# Patient Record
Sex: Female | Born: 1950 | Race: White | Hispanic: No | Marital: Married | Smoking: Never smoker
Health system: Southern US, Community
[De-identification: ages and names within clinical notes are randomized; demographics above are authoritative.]

## PROBLEM LIST (undated history)

## (undated) DIAGNOSIS — K8689 Other specified diseases of pancreas: Secondary | ICD-10-CM

## (undated) DIAGNOSIS — K838 Other specified diseases of biliary tract: Secondary | ICD-10-CM

## (undated) DIAGNOSIS — E669 Obesity, unspecified: Secondary | ICD-10-CM

## (undated) HISTORY — PX: ABDOMINAL HYSTERECTOMY: SHX81

## (undated) HISTORY — DX: Obesity, unspecified: E66.9

---

## 1984-10-23 HISTORY — PX: TOTAL ABDOMINAL HYSTERECTOMY W/ BILATERAL SALPINGOOPHORECTOMY: SHX83

## 2005-03-27 ENCOUNTER — Ambulatory Visit: Payer: Self-pay | Admitting: Internal Medicine

## 2006-05-17 ENCOUNTER — Ambulatory Visit: Payer: Self-pay | Admitting: Internal Medicine

## 2006-05-24 ENCOUNTER — Ambulatory Visit: Payer: Self-pay | Admitting: Internal Medicine

## 2006-06-01 ENCOUNTER — Ambulatory Visit: Payer: Self-pay | Admitting: Gastroenterology

## 2006-06-07 ENCOUNTER — Ambulatory Visit: Payer: Self-pay | Admitting: Gastroenterology

## 2006-06-20 ENCOUNTER — Ambulatory Visit: Payer: Self-pay | Admitting: Gastroenterology

## 2006-07-20 ENCOUNTER — Ambulatory Visit: Payer: Self-pay | Admitting: Gastroenterology

## 2006-08-13 ENCOUNTER — Ambulatory Visit: Payer: Self-pay | Admitting: Internal Medicine

## 2006-08-16 ENCOUNTER — Ambulatory Visit: Payer: Self-pay | Admitting: Gastroenterology

## 2006-08-27 ENCOUNTER — Ambulatory Visit: Payer: Self-pay | Admitting: Gastroenterology

## 2006-09-06 ENCOUNTER — Ambulatory Visit: Payer: Self-pay | Admitting: Gastroenterology

## 2006-09-14 ENCOUNTER — Ambulatory Visit: Payer: Self-pay | Admitting: Gastroenterology

## 2006-10-18 ENCOUNTER — Ambulatory Visit: Payer: Self-pay | Admitting: Gastroenterology

## 2006-10-23 HISTORY — PX: SMALL INTESTINE SURGERY: SHX150

## 2007-02-08 ENCOUNTER — Ambulatory Visit: Payer: Self-pay | Admitting: Gastroenterology

## 2007-02-28 ENCOUNTER — Ambulatory Visit: Payer: Self-pay | Admitting: Gastroenterology

## 2007-03-14 ENCOUNTER — Ambulatory Visit: Payer: Self-pay | Admitting: Nurse Practitioner

## 2007-07-04 ENCOUNTER — Ambulatory Visit: Payer: Self-pay | Admitting: Gastroenterology

## 2007-07-06 ENCOUNTER — Inpatient Hospital Stay: Payer: Self-pay | Admitting: Surgery

## 2007-07-08 ENCOUNTER — Other Ambulatory Visit: Payer: Self-pay

## 2007-07-23 ENCOUNTER — Other Ambulatory Visit: Payer: Self-pay

## 2007-07-24 ENCOUNTER — Inpatient Hospital Stay: Payer: Self-pay | Admitting: Internal Medicine

## 2007-07-31 ENCOUNTER — Ambulatory Visit: Payer: Self-pay | Admitting: Internal Medicine

## 2007-08-19 ENCOUNTER — Ambulatory Visit: Payer: Self-pay | Admitting: Internal Medicine

## 2007-10-03 ENCOUNTER — Ambulatory Visit: Payer: Self-pay | Admitting: Gastroenterology

## 2007-10-24 HISTORY — PX: CHOLECYSTECTOMY: SHX55

## 2008-01-16 ENCOUNTER — Ambulatory Visit: Payer: Self-pay | Admitting: Internal Medicine

## 2008-02-17 ENCOUNTER — Ambulatory Visit: Payer: Self-pay | Admitting: Internal Medicine

## 2008-05-11 ENCOUNTER — Ambulatory Visit: Payer: Self-pay | Admitting: Internal Medicine

## 2008-09-14 ENCOUNTER — Ambulatory Visit: Payer: Self-pay | Admitting: Internal Medicine

## 2009-01-01 ENCOUNTER — Ambulatory Visit: Payer: Self-pay | Admitting: Internal Medicine

## 2009-03-01 ENCOUNTER — Ambulatory Visit: Payer: Self-pay | Admitting: Internal Medicine

## 2009-04-22 ENCOUNTER — Ambulatory Visit: Payer: Self-pay | Admitting: Internal Medicine

## 2009-06-18 ENCOUNTER — Ambulatory Visit: Payer: Self-pay | Admitting: Internal Medicine

## 2009-10-25 ENCOUNTER — Ambulatory Visit: Payer: Self-pay | Admitting: Internal Medicine

## 2010-02-14 ENCOUNTER — Ambulatory Visit: Payer: Self-pay | Admitting: Internal Medicine

## 2010-02-25 ENCOUNTER — Ambulatory Visit: Payer: Self-pay | Admitting: Internal Medicine

## 2010-03-28 ENCOUNTER — Ambulatory Visit: Payer: Self-pay | Admitting: Internal Medicine

## 2010-05-02 ENCOUNTER — Ambulatory Visit: Payer: Self-pay | Admitting: Internal Medicine

## 2010-05-25 ENCOUNTER — Ambulatory Visit: Payer: Self-pay | Admitting: Internal Medicine

## 2010-08-25 ENCOUNTER — Ambulatory Visit: Payer: Self-pay | Admitting: Internal Medicine

## 2010-11-25 ENCOUNTER — Ambulatory Visit: Payer: Self-pay | Admitting: Internal Medicine

## 2011-03-13 ENCOUNTER — Ambulatory Visit: Payer: Self-pay | Admitting: Internal Medicine

## 2011-05-11 ENCOUNTER — Ambulatory Visit: Payer: Self-pay | Admitting: Internal Medicine

## 2011-05-26 ENCOUNTER — Ambulatory Visit: Payer: Self-pay | Admitting: Internal Medicine

## 2011-06-14 ENCOUNTER — Encounter: Payer: Self-pay | Admitting: Internal Medicine

## 2011-06-14 DIAGNOSIS — E119 Type 2 diabetes mellitus without complications: Secondary | ICD-10-CM | POA: Insufficient documentation

## 2011-06-14 DIAGNOSIS — I10 Essential (primary) hypertension: Secondary | ICD-10-CM

## 2011-06-16 ENCOUNTER — Other Ambulatory Visit: Payer: Self-pay | Admitting: Internal Medicine

## 2011-06-16 MED ORDER — LIRAGLUTIDE 18 MG/3ML ~~LOC~~ SOLN: SUBCUTANEOUS | Status: DC

## 2011-06-23 ENCOUNTER — Ambulatory Visit (INDEPENDENT_AMBULATORY_CARE_PROVIDER_SITE_OTHER): Payer: PRIVATE HEALTH INSURANCE | Admitting: Internal Medicine

## 2011-06-23 ENCOUNTER — Encounter: Payer: Self-pay | Admitting: Internal Medicine

## 2011-06-23 DIAGNOSIS — E119 Type 2 diabetes mellitus without complications: Secondary | ICD-10-CM

## 2011-06-23 DIAGNOSIS — H04129 Dry eye syndrome of unspecified lacrimal gland: Secondary | ICD-10-CM

## 2011-06-23 DIAGNOSIS — E669 Obesity, unspecified: Secondary | ICD-10-CM

## 2011-06-23 MED ORDER — LIRAGLUTIDE 18 MG/3ML ~~LOC~~ SOLN: SUBCUTANEOUS | Status: DC

## 2011-06-23 NOTE — Progress Notes (Signed)
  Subjective:    Patient ID: Kimberly Brady, female    DOB: [birth year], 60 y.o.   MRN: [id number]  HPI Patient hre to followup on diabetes, obesity, and hyperlipidemia.  Has been having trouble with her vision despite recent testing and two changes to the  prescription of her refractive lenses.  She has been doing some research on the internet and has many of the symptoms of dry eye syndrome.  Has an appt to see her opthalmologist again in 2 weeks but is concerned about the progressive vision loss in her  right eye, now 20/50. She has also been losing weight gradually using a weight loss method called JUDDD (caloric restriction followd by liberalization, alternating days).   Review of Systems  Constitutional: Negative for chills, fatigue and unexpected weight change.  HENT: Negative for hearing loss, ear pain, nosebleeds, congestion, sore throat, facial swelling, rhinorrhea, sneezing, mouth sores, trouble swallowing, neck pain, neck stiffness, voice change, postnasal drip, sinus pressure, tinnitus and ear discharge.   Eyes: Positive for visual disturbance. Negative for pain, discharge and redness.  Respiratory: Negative for cough, chest tightness, shortness of breath, wheezing and stridor.   Cardiovascular: Negative for chest pain, palpitations and leg swelling.  Musculoskeletal: Negative for myalgias and arthralgias.  Skin: Negative for color change and rash.  Neurological: Negative for dizziness, weakness, light-headedness and headaches.  Hematological: Negative for adenopathy.       Objective:   Physical Exam  Constitutional: She is oriented to person, place, and time. She appears well-developed and well-nourished.  HENT:  Mouth/Throat: Oropharynx is clear and moist.  Eyes: EOM are normal. Pupils are equal, round, and reactive to light. No scleral icterus.  Neck: Normal range of motion. Neck supple. No JVD present. No thyromegaly present.  Cardiovascular: Normal rate, regular rhythm,  normal heart sounds and intact distal pulses.   Pulmonary/Chest: Effort normal and breath sounds normal.  Abdominal: Soft. Bowel sounds are normal. She exhibits no mass. There is no tenderness.  Musculoskeletal: Normal range of motion. She exhibits no edema.  Lymphadenopathy:    She has no cervical adenopathy.  Neurological: She is alert and oriented to person, place, and time.  Skin: Skin is warm and dry.  Psychiatric: She has a normal mood and affect.          Assessment & Plan:  Dm  excelletn control,  hgba1c was 5.7 recently.  Has lost 15 lbs since last visit. Using JUDDD (up day, down day diet) with consistent results .

## 2011-06-23 NOTE — Patient Instructions (Addendum)
Your diabetes control is excellent and your weight loss is fantastic.  Continue everything you are doing but add 30 minutes of exercise to your daily routine a minium of 5 days per week.  If your weight plateaus, we can discuss an appetite suppressant. \ Trial of Systane eye drop to moisten your eyes

## 2011-06-24 DIAGNOSIS — E669 Obesity, unspecified: Secondary | ICD-10-CM | POA: Insufficient documentation

## 2011-06-24 DIAGNOSIS — H04129 Dry eye syndrome of unspecified lacrimal gland: Secondary | ICD-10-CM | POA: Insufficient documentation

## 2011-06-24 MED ORDER — POLYETHYL GLYCOL-PROPYL GLYCOL 0.4-0.3 % OP SOLN: 1.0000 [drp] | Freq: Four times a day (QID) | OPHTHALMIC | Status: DC

## 2011-06-24 NOTE — Assessment & Plan Note (Addendum)
Excellent control using Victoza injections and carbohydrate modified diet. BS are generally < 130 in the AM fasting and <150 2 hr post prandially.

## 2011-06-24 NOTE — Assessment & Plan Note (Signed)
Suggested by history of DM, demographics, and symptoms of blurred vision despite normal recent retinal exam. Will treat empirically with Systane eye drops pending repeat evaluation by Optho

## 2011-06-26 ENCOUNTER — Other Ambulatory Visit: Payer: Self-pay | Admitting: Internal Medicine

## 2011-09-06 ENCOUNTER — Telehealth: Payer: Self-pay | Admitting: Internal Medicine

## 2011-09-06 NOTE — Telephone Encounter (Signed)
Notified patient of results 

## 2011-09-06 NOTE — Telephone Encounter (Signed)
Labs were excellent hgba1c 6.0.

## 2011-09-22 ENCOUNTER — Encounter: Payer: Self-pay | Admitting: Internal Medicine

## 2011-09-22 ENCOUNTER — Ambulatory Visit (INDEPENDENT_AMBULATORY_CARE_PROVIDER_SITE_OTHER): Payer: PRIVATE HEALTH INSURANCE | Admitting: Internal Medicine

## 2011-09-22 DIAGNOSIS — E119 Type 2 diabetes mellitus without complications: Secondary | ICD-10-CM

## 2011-09-22 DIAGNOSIS — H04129 Dry eye syndrome of unspecified lacrimal gland: Secondary | ICD-10-CM

## 2011-09-22 DIAGNOSIS — H269 Unspecified cataract: Secondary | ICD-10-CM

## 2011-09-22 DIAGNOSIS — E785 Hyperlipidemia, unspecified: Secondary | ICD-10-CM

## 2011-09-22 MED ORDER — LIRAGLUTIDE 18 MG/3ML ~~LOC~~ SOLN: SUBCUTANEOUS | Status: DC

## 2011-09-22 NOTE — Progress Notes (Signed)
Subjective:    Patient ID: Kimberly Brady, female    DOB: [birth year], 60 y.o.   MRN: [id number]  HPI     Kimberly Brady is an 60 y.o. female who presents for follow up of diabetes. Current symptoms include: none. Patient denies foot ulcerations, hypoglycemia  and nausea. Evaluation to date has included: fasting blood sugar, fasting lipid panel, hemoglobin A1C and microalbuminuria. Home sugars: BGs consistently in an acceptable range. Current treatments: more intensive attention to diet which has been effective, increased aerobic exercise which has been not very effective and continued use of Victoza. Last dilated eye exam within the pst 6 months , with early cataractsw noted by Dr. Clotilde Dieter for serial cataract surgery Dec 12 and Jan 2 nd. preop is next Wednesday.  The following portions of the patient's history were reviewed and updated as appropriate: allergies, current medications, past family history, past medical history, past social history, past surgical history and problem list.  Review of Systems A comprehensive review of systems was negative.     Physical Exam     Objective:    BP 124/76  Pulse 68  Temp(Src) 98.2 F (36.8 C) (Oral)  Resp 16  Ht 5' 1.5" (1.562 m)  Wt 211 lb 8 oz (95.936 kg)  BMI 39.32 kg/m2  SpO2 98%  General Appearance:    Alert, cooperative, no distress, appears stated age  Head:    Normocephalic, without obvious abnormality, atraumatic  Eyes:    PERRL, conjunctiva/corneas clear, EOM's intact, fundi    benign, both eyes  Ears:    Normal TM's and external ear canals, both ears  Nose:   Nares normal, septum midline, mucosa normal, no drainage    or sinus tenderness  Throat:   Lips, mucosa, and tongue normal; teeth and gums normal  Neck:   Supple, symmetrical, trachea midline, no adenopathy;    thyroid:  no enlargement/tenderness/nodules; no carotid   bruit or JVD  Back:     Symmetric, no curvature, ROM normal, no CVA tenderness  Lungs:     Clear  to auscultation bilaterally, respirations unlabored  Chest Wall:    No tenderness or deformity   Heart:    Regular rate and rhythm, S1 and S2 normal, no murmur, rub   or gallop  Abdomen:     Soft, non-tender, bowel sounds active all four quadrants,    no masses, no organomegaly  Extremities:   Extremities normal, atraumatic, no cyanosis or edema  Pulses:   2+ and symmetric all extremities  Skin:   Skin color, texture, turgor normal, no rashes or lesions  Lymph nodes:   Cervical, supraclavicular, and axillary nodes normal  Neurologic:   CNII-XII intact, normal strength, sensation and reflexes    throughout    Laboratory:   Revieed labs from [facility] in October .  hgba1c waws 6.0, HD 44 and LDL 78.  Normal liver and kidney function.   Assessment:    Diabetes mellitus Type II, under excellent control.  Obesity Early Cataracts   Plan:    Dry eye syndrome Her recent symptoms have been evaluated by [facility] and are not dry eye but are due to catrqcts.   Bilateral cataracts Found during recent optho evaluation for vision changes initial attributed to dry eye syndrome. For surgery in the next 30 days on both, by [facility].   Hyperlipidemia LDL goal < 70 LDL was 78 in October on red yeast rice.  No changes today to treatment.     Updated  Medication List Outpatient Encounter Prescriptions as of 09/22/2011  Medication Sig Dispense Refill  . Liraglutide (VICTOZA) 18 MG/3ML SOLN Inject 1.8 mg subcutaneous daily   27 mL  3  . Red Yeast Rice Extract 600 MG CAPS Take 600 mg by mouth 2 (two) times daily.        Marland Kitchen DISCONTD: Liraglutide (VICTOZA) 18 MG/3ML SOLN Inject 1.8 mg subcutaneous daily   27 mL  3  . DISCONTD: Polyethyl Glycol-Propyl Glycol (SYSTANE) 0.4-0.3 % SOLN Apply 1 drop to eye QID.  1 Bottle  3     Discussed general issues about diabetes pathophysiology and management. Addressed ADA diet. Encouraged aerobic exercise. Follow up in 3 months or as needed.  For cataract surgery  on both eyes scheduled for the next 30 days

## 2011-09-24 ENCOUNTER — Encounter: Payer: Self-pay | Admitting: Internal Medicine

## 2011-09-24 DIAGNOSIS — E785 Hyperlipidemia, unspecified: Secondary | ICD-10-CM | POA: Insufficient documentation

## 2011-09-24 DIAGNOSIS — H269 Unspecified cataract: Secondary | ICD-10-CM | POA: Insufficient documentation

## 2011-09-24 NOTE — Assessment & Plan Note (Signed)
LDL was 78 in October on red yeast rice.  No changes today to treatment.

## 2011-09-24 NOTE — Assessment & Plan Note (Signed)
Found during recent optho evaluation for vision changes initial attributed to dry eye syndrome. For surgery in the next 30 days on both, by [facility].

## 2011-09-24 NOTE — Assessment & Plan Note (Signed)
Her recent symptoms have been evaluated by [facility] and are not dry eye but are due to catrqcts.

## 2011-10-04 ENCOUNTER — Ambulatory Visit: Payer: Self-pay | Admitting: Ophthalmology

## 2011-10-18 ENCOUNTER — Telehealth: Payer: Self-pay | Admitting: *Deleted

## 2011-10-18 NOTE — Telephone Encounter (Signed)
Pt states the victoza script that was ordered for her at her last visit was never received by her pharmacy- it looks like script was printed in error.  She asked that this be called in to [facility] in [place].  Script called in.

## 2011-10-24 HISTORY — PX: EYE SURGERY: SHX253

## 2011-10-25 ENCOUNTER — Ambulatory Visit: Payer: Self-pay | Admitting: Ophthalmology

## 2011-12-22 ENCOUNTER — Ambulatory Visit: Payer: PRIVATE HEALTH INSURANCE | Admitting: Internal Medicine

## 2011-12-29 ENCOUNTER — Ambulatory Visit (INDEPENDENT_AMBULATORY_CARE_PROVIDER_SITE_OTHER): Payer: PRIVATE HEALTH INSURANCE | Admitting: Internal Medicine

## 2011-12-29 ENCOUNTER — Encounter: Payer: Self-pay | Admitting: Internal Medicine

## 2011-12-29 DIAGNOSIS — E785 Hyperlipidemia, unspecified: Secondary | ICD-10-CM

## 2011-12-29 DIAGNOSIS — E1136 Type 2 diabetes mellitus with diabetic cataract: Secondary | ICD-10-CM

## 2011-12-29 DIAGNOSIS — I10 Essential (primary) hypertension: Secondary | ICD-10-CM

## 2011-12-29 DIAGNOSIS — E1339 Other specified diabetes mellitus with other diabetic ophthalmic complication: Secondary | ICD-10-CM

## 2011-12-29 DIAGNOSIS — E1139 Type 2 diabetes mellitus with other diabetic ophthalmic complication: Secondary | ICD-10-CM

## 2011-12-29 DIAGNOSIS — E1336 Other specified diabetes mellitus with diabetic cataract: Secondary | ICD-10-CM

## 2011-12-29 DIAGNOSIS — E119 Type 2 diabetes mellitus without complications: Secondary | ICD-10-CM

## 2011-12-29 DIAGNOSIS — H579 Unspecified disorder of eye and adnexa: Secondary | ICD-10-CM

## 2011-12-29 MED ORDER — LIRAGLUTIDE 18 MG/3ML ~~LOC~~ SOLN: SUBCUTANEOUS | Status: DC

## 2011-12-29 NOTE — Assessment & Plan Note (Addendum)
hgba1c was 6.3 in [facility]   No changes today.  Continue Victoza.

## 2011-12-29 NOTE — Progress Notes (Signed)
  Subjective:    Patient ID: Kimberly Brady, female    DOB: [birth year], 61 y.o.   MRN: [id number]  HPI  Kimberly Brady returns for follow up on diabetes and obesity.  She is walking three times weekly, but has not been following a diet. Has gained 3 lbs in the interim.  No hypoglycemic episodes on current regimen of Victoza.  Had cataract surgery on both eyes recently with good results.   Past Medical History  Diagnosis Date  . Hypertension   . Diabetes mellitus     type 2  . Obesity (BMI 30-39.9)    Current Outpatient Prescriptions on File Prior to Visit  Medication Sig Dispense Refill  . Red Yeast Rice Extract 600 MG CAPS Take 600 mg by mouth 2 (two) times daily.            Review of Systems  Constitutional: Negative for fever, chills and unexpected weight change.  HENT: Negative for hearing loss, ear pain, nosebleeds, congestion, sore throat, facial swelling, rhinorrhea, sneezing, mouth sores, trouble swallowing, neck pain, neck stiffness, voice change, postnasal drip, sinus pressure, tinnitus and ear discharge.   Eyes: Negative for pain, discharge, redness and visual disturbance.  Respiratory: Negative for cough, chest tightness, shortness of breath, wheezing and stridor.   Cardiovascular: Negative for chest pain, palpitations and leg swelling.  Musculoskeletal: Negative for myalgias and arthralgias.  Skin: Negative for color change and rash.  Neurological: Negative for dizziness, weakness, light-headedness and headaches.  Hematological: Negative for adenopathy.       Objective:   Physical Exam  Constitutional: She is oriented to person, place, and time. She appears well-developed and well-nourished.  HENT:  Mouth/Throat: Oropharynx is clear and moist.  Eyes: EOM are normal. Pupils are equal, round, and reactive to light. No scleral icterus.  Neck: Normal range of motion. Neck supple. No JVD present. No thyromegaly present.  Cardiovascular: Normal rate, regular rhythm, normal heart  sounds and intact distal pulses.   Pulmonary/Chest: Effort normal and breath sounds normal.  Abdominal: Soft. Bowel sounds are normal. She exhibits no mass. There is no tenderness.  Musculoskeletal: Normal range of motion. She exhibits no edema.  Lymphadenopathy:    She has no cervical adenopathy.  Neurological: She is alert and oriented to person, place, and time.  Skin: Skin is warm and dry.  Psychiatric: She has a normal mood and affect.      Assessment & Plan:   Diabetes mellitus hgba1c was 6.3 [facility]   No changes today.  Continue Victoza.  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    Hypertension Well controlled on current medications.  No changes today.   Hyperlipidemia LDL goal < 70 LDL is near goal without statins,  Continue red yeast rice.     Updated Medication List Outpatient Encounter Prescriptions as of 12/29/2011  Medication Sig Dispense Refill  . Liraglutide (VICTOZA) 18 MG/3ML SOLN Inject 1.8 mg subcutaneous daily  27 mL  3  . Red Yeast Rice Extract 600 MG CAPS Take 600 mg by mouth 2 (two) times daily.        Marland Kitchen DISCONTD: Liraglutide (VICTOZA) 18 MG/3ML SOLN Inject 1.8 mg subcutaneous daily   27 mL  3

## 2011-12-29 NOTE — Assessment & Plan Note (Addendum)
She has had extraction of the right eye, followed by left surgery in December  By [facility].

## 2011-12-31 ENCOUNTER — Encounter: Payer: Self-pay | Admitting: Internal Medicine

## 2011-12-31 NOTE — Assessment & Plan Note (Signed)
LDL is near goal without statins,  Continue red yeast rice.

## 2011-12-31 NOTE — Assessment & Plan Note (Signed)
Well controlled on current medications.  No changes today. 

## 2012-03-08 ENCOUNTER — Ambulatory Visit: Payer: Self-pay | Admitting: Internal Medicine

## 2012-03-08 LAB — LIPID PANEL
Cholesterol: 174 mg/dL (ref 0–200)
HDL Cholesterol: 46 mg/dL (ref 40–60)
Ldl Cholesterol, Calc: 100 mg/dL (ref 0–100)

## 2012-03-14 ENCOUNTER — Telehealth: Payer: Self-pay | Admitting: Internal Medicine

## 2012-03-14 NOTE — Telephone Encounter (Signed)
Patient stated they are not going to a new doctor she stated "Dr. Darrick Huntsman ought to know better than that!"  She needs the labs for diabetic counseling.

## 2012-03-14 NOTE — Telephone Encounter (Signed)
Yes I have received both of their labs. Are they changing doctors?  I  Will give them to you to fax

## 2012-03-14 NOTE — Telephone Encounter (Signed)
[phone] Pt called to see if you received labs from [facility] the lab were done Friday 17 she needs this faxed to [phone] She needs them to take to doc tomorrow

## 2012-03-21 ENCOUNTER — Encounter: Payer: Self-pay | Admitting: Internal Medicine

## 2012-04-05 ENCOUNTER — Ambulatory Visit: Payer: PRIVATE HEALTH INSURANCE | Admitting: Internal Medicine

## 2012-04-12 ENCOUNTER — Ambulatory Visit (INDEPENDENT_AMBULATORY_CARE_PROVIDER_SITE_OTHER): Payer: PRIVATE HEALTH INSURANCE | Admitting: Internal Medicine

## 2012-04-12 ENCOUNTER — Encounter: Payer: Self-pay | Admitting: Internal Medicine

## 2012-04-12 VITALS — BP 112/78 | HR 66 | Temp 98.0°F | Resp 14 | Wt 211.5 lb

## 2012-04-12 DIAGNOSIS — I10 Essential (primary) hypertension: Secondary | ICD-10-CM

## 2012-04-12 DIAGNOSIS — E119 Type 2 diabetes mellitus without complications: Secondary | ICD-10-CM

## 2012-04-12 DIAGNOSIS — E785 Hyperlipidemia, unspecified: Secondary | ICD-10-CM

## 2012-04-12 DIAGNOSIS — E669 Obesity, unspecified: Secondary | ICD-10-CM

## 2012-04-12 MED ORDER — LIRAGLUTIDE 18 MG/3ML ~~LOC~~ SOLN: SUBCUTANEOUS | Status: DC

## 2012-04-14 ENCOUNTER — Encounter: Payer: Self-pay | Admitting: Internal Medicine

## 2012-04-14 NOTE — Progress Notes (Signed)
Patient ID: Kimberly Brady, female   DOB: 05/20/1951, 61 y.o.   MRN: [id number]  Patient Active Problem List  Diagnosis  . Hypertension  . Diabetes mellitus type 2 in obese  . Obesity (BMI 30-39.9)  . Bilateral cataracts  . Hyperlipidemia LDL goal < 70  . 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 Follow-up    3 month    HPI:   Kimberly Brady a 61 y.o. female who presents for follow up on diabetes mellitus, hyperlipidemia, and obesity.  She has made no significant change in her weight due to her dietary habits and lack of exercise.  She feels generally well, has no joint complaints, chst pian or shortness of breath.  Blood sugars remian well controlled on current regimen. Denies nausea, vomiting or diarrhea.    Past Medical History  Diagnosis Date  . Hypertension   . Diabetes mellitus     type 2  . Obesity (BMI 30-39.9)     Past Surgical History  Procedure Date  . Total abdominal hysterectomy w/ bilateral salpingoophorectomy 1986    for polycystic ovaries,endometriosis  . Cholecystectomy 2009    lapaoscopic   The following portions of the patient's history were reviewed and updated as appropriate: Allergies, current medications, and problem list.   Review of Systems:  A  comprehensive  review of systems was negative except those addressed in the HPI,     History   Social History  . Marital Status: Married    Spouse Name: N/A    Number of Children: N/A  . Years of Education: N/A   Occupational History  . Not on file.   Social History Main Topics  . Smoking status: Never Smoker   . Smokeless tobacco: Never Used  . Alcohol Use: No  . Drug Use: No  . Sexually Active: Not on file   Other Topics Concern  . Not on file   Social History Narrative   THERE ARE LAB RESULTS IN THE PAPER CHART BUT NOT UPDATED IN COMPUTER SYSTEM AS I WAS UNABLE TO READ THE RESULTS ACCURATELY...    Objective:  BP  112/78  Pulse 66  Temp 98 F (36.7 C) (Oral)  Resp 14  Wt 211 lb 8 oz (95.936 kg)  SpO2 97%  General appearance: alert, cooperative and appears stated age Ears: normal TM's and external ear canals both ears Throat: lips, mucosa, and tongue normal; teeth and gums normal Neck: no adenopathy, no carotid bruit, supple, symmetrical, trachea midline and thyroid not enlarged, symmetric, no tenderness/mass/nodules Back: symmetric, no curvature. ROM normal. No CVA tenderness. Lungs: clear to auscultation bilaterally Heart: regular rate and rhythm, S1, S2 normal, no murmur, click, rub or gallop Abdomen: soft, non-tender; bowel sounds normal; no masses,  no organomegaly Pulses: 2+ and symmetric Skin: Skin color, texture, turgor normal. No rashes or lesions Lymph nodes: Cervical, supraclavicular, and axillary nodes normal.  Assessment and Plan:  Obesity (BMI 30-39.9) Advised to resume her or my diet and exercise daily.   Diabetes mellitus type 2 in obese Well controlled on current medications.  hgba1c is < 7.0 No changes today.  Hyperlipidemia LDL goal < 70 LDL remains above goal without medications. given her history of pancreatitis and elevated LFTs, will hold off on statin therapy unless LDl is significantly over 347.  Hypertension Well controlled on current regimen. Renal function stable, no changes today.   Updated Medication List Outpatient  Encounter Prescriptions as of 04/12/2012  Medication Sig Dispense Refill  . Liraglutide (VICTOZA) 18 MG/3ML SOLN Inject 1.8 mg subcutaneous daily  27 mL  3  . Red Yeast Rice Extract 600 MG CAPS Take 600 mg by mouth 2 (two) times daily.        Marland Kitchen DISCONTD: Liraglutide (VICTOZA) 18 MG/3ML SOLN Inject 1.8 mg subcutaneous daily  27 mL  3     No orders of the defined types were placed in this encounter.    Return in about 3 months (around 07/13/2012).

## 2012-04-14 NOTE — Assessment & Plan Note (Signed)
Well controlled on current regimen. Renal function stable, no changes today. 

## 2012-04-14 NOTE — Assessment & Plan Note (Signed)
Well controlled on current medications.  hgba1c is < 7.0 No changes today.

## 2012-04-14 NOTE — Assessment & Plan Note (Signed)
Advised to resume her or my diet and exercise daily.

## 2012-04-14 NOTE — Assessment & Plan Note (Signed)
LDL remains above goal without medications. given her history of pancreatitis and elevated LFTs, will hold off on statin therapy unless LDl is significantly over 161.

## 2012-05-10 ENCOUNTER — Ambulatory Visit: Payer: Self-pay | Admitting: Emergency Medicine

## 2012-05-10 LAB — URINALYSIS, COMPLETE
Bilirubin,UR: NEGATIVE
Glucose,UR: NEGATIVE mg/dL (ref 0–75)
Ketone: NEGATIVE
Nitrite: NEGATIVE
Ph: 5 (ref 4.5–8.0)
Protein: NEGATIVE
Specific Gravity: 1.02 (ref 1.003–1.030)

## 2012-05-10 LAB — COMPREHENSIVE METABOLIC PANEL
Albumin: 3.6 g/dL (ref 3.4–5.0)
Alkaline Phosphatase: 91 U/L (ref 50–136)
Anion Gap: 3 — ABNORMAL LOW (ref 7–16)
BUN: 14 mg/dL (ref 7–18)
Bilirubin,Total: 0.9 mg/dL (ref 0.2–1.0)
Calcium, Total: 9.1 mg/dL (ref 8.5–10.1)
Chloride: 103 mmol/L (ref 98–107)
Co2: 33 mmol/L — ABNORMAL HIGH (ref 21–32)
Creatinine: 0.85 mg/dL (ref 0.60–1.30)
EGFR (African American): >60
EGFR (Non-African Amer.): >60
Glucose: 101 mg/dL — ABNORMAL HIGH (ref 65–99)
Osmolality: 278 (ref 275–301)
Potassium: 4.5 mmol/L (ref 3.5–5.1)
SGOT(AST): 24 U/L (ref 15–37)
SGPT (ALT): 37 U/L
Sodium: 139 mmol/L (ref 136–145)
Total Protein: 7.9 g/dL (ref 6.4–8.2)

## 2012-05-10 LAB — CBC WITH DIFFERENTIAL/PLATELET
Basophil #: 0 10*3/uL (ref 0.0–0.1)
Basophil %: 0.8 %
Eosinophil #: 0 10*3/uL (ref 0.0–0.7)
Eosinophil %: 0.5 %
HCT: 42.8 % (ref 35.0–47.0)
HGB: 14.8 g/dL (ref 12.0–16.0)
Lymphocyte #: 0.8 10*3/uL — ABNORMAL LOW (ref 1.0–3.6)
Lymphocyte %: 26.5 %
MCH: 31.2 pg (ref 26.0–34.0)
MCHC: 34.6 g/dL (ref 32.0–36.0)
MCV: 90 fL (ref 80–100)
Monocyte #: 0.4 x10 3/mm (ref 0.2–0.9)
Monocyte %: 13.8 %
Neutrophil #: 1.7 10*3/uL (ref 1.4–6.5)
Neutrophil %: 58.4 %
Platelet: 133 10*3/uL — ABNORMAL LOW (ref 150–440)
RBC: 4.75 10*6/uL (ref 3.80–5.20)
RDW: 12.7 % (ref 11.5–14.5)
WBC: 2.9 10*3/uL — ABNORMAL LOW (ref 3.6–11.0)

## 2012-05-10 LAB — SEDIMENTATION RATE: Erythrocyte Sed Rate: 43 mm/hr — ABNORMAL HIGH (ref 0–30)

## 2012-05-11 LAB — URINE CULTURE

## 2012-05-23 LAB — HM DIABETES EYE EXAM: HM Diabetic Eye Exam: NORMAL

## 2012-06-17 ENCOUNTER — Ambulatory Visit: Payer: Self-pay | Admitting: Internal Medicine

## 2012-06-17 LAB — COMPREHENSIVE METABOLIC PANEL
Albumin: 3.7 g/dL (ref 3.4–5.0)
Alkaline Phosphatase: 74 U/L (ref 50–136)
BUN: 14 mg/dL (ref 7–18)
Bilirubin,Total: 0.5 mg/dL (ref 0.2–1.0)
Co2: 31 mmol/L (ref 21–32)
Creatinine: 0.73 mg/dL (ref 0.60–1.30)
EGFR (African American): >60
Glucose: 107 mg/dL — ABNORMAL HIGH (ref 65–99)
Osmolality: 277 (ref 275–301)
Potassium: 4.8 mmol/L (ref 3.5–5.1)
Sodium: 138 mmol/L (ref 136–145)
Total Protein: 7.5 g/dL (ref 6.4–8.2)

## 2012-06-17 LAB — HEMOGLOBIN A1C: Hemoglobin A1C: 5.7 % (ref 4.2–6.3)

## 2012-07-01 ENCOUNTER — Telehealth: Payer: Self-pay | Admitting: Internal Medicine

## 2012-07-01 NOTE — Telephone Encounter (Signed)
Her A1c is down to 5.7 !! Is she having any lows?

## 2012-07-02 NOTE — Telephone Encounter (Signed)
Patient notified.  She stated she is not having any lows, and her blood sugars are fine.

## 2012-07-17 ENCOUNTER — Encounter: Payer: Self-pay | Admitting: Internal Medicine

## 2012-07-19 ENCOUNTER — Ambulatory Visit (INDEPENDENT_AMBULATORY_CARE_PROVIDER_SITE_OTHER): Payer: PRIVATE HEALTH INSURANCE | Admitting: Internal Medicine

## 2012-07-19 ENCOUNTER — Encounter: Payer: Self-pay | Admitting: Internal Medicine

## 2012-07-19 VITALS — BP 110/68 | HR 60 | Temp 99.1°F | Resp 16 | Wt 213.2 lb

## 2012-07-19 DIAGNOSIS — E119 Type 2 diabetes mellitus without complications: Secondary | ICD-10-CM

## 2012-07-19 DIAGNOSIS — E669 Obesity, unspecified: Secondary | ICD-10-CM

## 2012-07-19 DIAGNOSIS — E1169 Type 2 diabetes mellitus with other specified complication: Secondary | ICD-10-CM

## 2012-07-19 NOTE — Progress Notes (Signed)
Patient ID: Kimberly Brady, female   DOB: 02-Nov-1950, 61 y.o.   MRN: [id number]  Patient Active Problem List  Diagnosis  . Hypertension  . Diabetes mellitus type 2 in obese  . Obesity (BMI 30-39.9)  . Bilateral cataracts  . Hyperlipidemia LDL goal < 70  . 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 Follow-up    3 month    HPI:   Kimberly Brady a 61 y.o. female who presents Follow up on diabetes mellitus, hyperlipidemia, and obesity.  She has made no significant change in her weight due to her dietary habits and lack of exercise.  She feels generally well, has no joint complaints, chst pian or shortness of breath.  Blood sugars remian well controlled on current regimen. Denies nausea, vomiting or diarrhea.    Past Medical History  Diagnosis Date  . Hypertension   . Diabetes mellitus     type 2  . Obesity (BMI 30-39.9)     Past Surgical History  Procedure Date  . Total abdominal hysterectomy w/ bilateral salpingoophorectomy 1986    for polycystic ovaries,endometriosis  . Cholecystectomy 2009    lapaoscopic         The following portions of the patient's history were reviewed and updated as appropriate: Allergies, current medications, and problem list.    Review of Systems:   12 Pt  review of systems was negative except those addressed in the HPI,     History   Social History  . Marital Status: Married    Spouse Name: N/A    Number of Children: N/A  . Years of Education: N/A   Occupational History  . Not on file.   Social History Main Topics  . Smoking status: Never Smoker   . Smokeless tobacco: Never Used  . Alcohol Use: No  . Drug Use: No  . Sexually Active: Not on file   Other Topics Concern  . 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 Walks daily at lunch. NO alcohol or tobacco use.  Objective:  BP 110/68  Pulse 60  Temp 99.1 F (37.3 C) (Oral)  Resp 16   Wt 213 lb 4 oz (96.73 kg)  SpO2 97%  General appearance: alert, cooperative and appears stated age Ears: normal TM's and external ear canals both ears Throat: lips, mucosa, and tongue normal; teeth and gums normal Neck: no adenopathy, no carotid bruit, supple, symmetrical, trachea midline and thyroid not enlarged, symmetric, no tenderness/mass/nodules Back: symmetric, no curvature. ROM normal. No CVA tenderness. Lungs: clear to auscultation bilaterally Heart: regular rate and rhythm, S1, S2 normal, no murmur, click, rub or gallop Abdomen: soft, non-tender; bowel sounds normal; no masses,  no organomegaly Pulses: 2+ and symmetric Skin: Skin color, texture, turgor normal. No rashes or lesions Lymph nodes: Cervical, supraclavicular, and axillary nodes normal.  Assessment and Plan:  Obesity (BMI 30-39.9) She's gained a few pounds since her last visit.Marland Kitchen Previously she had lost 10 pounds using the JU DDD method  Diet, which involves alternating between fasting and eating whatever she wanted. I recommended a low glycemic index diet since since then trying to encourage her husband to follow the same diet.   Diabetes mellitus type 2 in obese Extremely well controlled with intermittent use of the toes. Her diet is excellent progress to diabetes management. Her she remains obese with BMI 37 recommended adding exercise   Updated  Medication List Outpatient Encounter Prescriptions as of 07/19/2012  Medication Sig Dispense Refill  . Liraglutide (VICTOZA) 18 MG/3ML SOLN Inject 1.8 mg subcutaneous daily  27 mL  3  . Red Yeast Rice Extract 600 MG CAPS Take 600 mg by mouth 2 (two) times daily.           No orders of the defined types were placed in this encounter.    Return in about 3 months (around 10/18/2012).        Subjective:     Kimberly Brady is a 61 y.o. female who presents for follow up of diabetes.. Current symptoms include: none. Patient denies foot ulcerations, increased appetite  and paresthesia of the feet. Evaluation to date has been: fasting blood sugar, fasting lipid panel, hemoglobin A1C and microalbuminuria. Home sugars: BGs consistently in an acceptable range. Current treatments: Byetta which she takes intermittently. Last dilated eye exam 2013.  The following portions of the patient's history were reviewed and updated as appropriate: allergies, current medications, past family history, past medical history, past social history, past surgical history and problem list.  Review of Systems Pertinent items are noted in HPI.    Objective:    BP 110/68  Pulse 60  Temp 99.1 F (37.3 C) (Oral)  Resp 16  Wt 213 lb 4 oz (96.73 kg)  SpO2 97% General appearance: alert, cooperative and appears stated age Eyes: conjunctivae/corneas clear. PERRL, EOM's intact. Fundi benign. Throat: lips, mucosa, and tongue normal; teeth and gums normal Neck: no adenopathy, no carotid bruit, no JVD, supple, symmetrical, trachea midline and thyroid not enlarged, symmetric, no tenderness/mass/nodules Lungs: clear to auscultation bilaterally Heart: regular rate and rhythm, S1, S2 normal, no murmur, click, rub or gallop Extremities: extremities normal, atraumatic, no cyanosis or edema Pulses: 2+ and symmetric Skin: Skin color, texture, turgor normal. No rashes or lesions   Foot exam:  Nails are well trimmed,  No callouses,  Sensation intact to microfilament   Patient was not evaluated for proper footwear and sizing.  Laboratory: No components found with this basename: A1C      Assessment:    Diabetes mellitus Type II, under excellent control.    Plan:    Encouraged aerobic exercise.

## 2012-07-19 NOTE — Patient Instructions (Addendum)
This is  Dr. Melina Schools version of a  "Low GI"  Diet:  All of the foods can be found at grocery stores and in bulk at [employer].  The Atkins protein bars and shakes are available in more varieties at Target, [employer] and [employer].     7 AM Breakfast:  Low carbohydrate Protein  Shakes (I recommend the EAS AdvantEdge "Carb Control" shakes  Or the low carb shakes by Atkins.   Both are available everywhere:  In  cases at [employer]Js  Or in 4 packs at grocery stores and pharmacies  2.5 carbs  (Alternative is  a toasted Arnold's Sandwhich Thin w/ peanut butter, a "Bagel Thin" with cream cheese and salmon) or  a scrambled egg burrito made with a low carb tortilla .  Avoid cereal and bananas, oatmeal too unless you are cooking the old fashioned kind that takes 30-40 minutes to prepare.  the rest is overly processed, has minimal fiber, and is loaded with carbohydrates!   10 AM: Protein bar by Atkins (the snack size, under 200 cal).  There are many varieties , available widely again or in bulk in limited varieties at BJs)  Other so called "protein bars" tend to be loaded with carbohydrates.  Remember, in food advertising, the word "energy" is synonymous for " carbohydrate."  Lunch: sandwich of [place], (or any lunchmeat, grilled meat or canned tuna), fresh avocado, mayonnaise  and cheese on a lower carbohydrate pita bread, flatbread, or tortilla . Ok to use regular mayonnaise. The bread is the only source or carbohydrate that can be decreased (Joseph's makes a pita bread and a flat bread that are 50 cal and 4 net carbs ; Toufayan makes a low carb flatbread that's 100 cal and 9 net carbs  and  Mission makes a low carb whole wheat tortilla  That is 210 cal and 6 net carbs)  3 PM:  Mid day :  Another protein bar,  Or a  cheese stick (100 cal, 0 carbs),  Or 1 ounce of  almonds, walnuts, pistachios, pecans, peanuts,  Macadamia nuts. Or a Dannon light n Fit greek yogurt, 80 cal 8 net carbs . Avoid "granola"; the dried cranberries  and raisins are loaded with carbohydrates. Mixed nuts ok if no raisins or cranberries or dried fruit.      6 PM  Dinner:  "mean and green:"  Meat/chicken/fish or a high protein legume; , with a green salad, and a low GI  Veggie (broccoli, cauliflower, green beans, spinach, brussel sprouts. Lima beans) : Avoid "Low fat dressings, as well as Reyne Dumas and [address]! They are loaded with sugar! Instead use ranch, vinagrette,  Blue cheese, etc  9 PM snack : Breyer's "low carb" fudgsicle or  ice cream bar (Carb Smart line), or  Weight Watcher's ice cream bar , or another "no sugar added" ice cream;a serving of fresh berries/cherries with whipped cream (Avoid bananas, pineapple, grapes  and watermelon on a regular basis because they are high in sugar)   Remember that snack Substitutions should be less than 15 to 20 carbs  Per serving. Remember to subtract fiber grams and sugar alcohols to get the "net carbs."   Look for the Joseph's brand of flax breads at BJs and [facility]

## 2012-07-21 ENCOUNTER — Encounter: Payer: Self-pay | Admitting: Internal Medicine

## 2012-07-21 NOTE — Assessment & Plan Note (Signed)
She's gained a few pounds since her last visit.Marland Kitchen Previously she had lost 10 pounds using the JU DDD method  Diet, which involves alternating between fasting and eating whatever she wanted. I recommended a low glycemic index diet since since then trying to encourage her husband to follow the same diet.

## 2012-07-21 NOTE — Assessment & Plan Note (Signed)
Extremely well controlled with intermittent use of the toes. Her diet is excellent progress to diabetes management. Her she remains obese with BMI 37 recommended adding exercise

## 2012-09-29 ENCOUNTER — Telehealth: Payer: Self-pay | Admitting: Internal Medicine

## 2012-09-29 NOTE — Telephone Encounter (Signed)
a1c is 6.2

## 2012-09-30 NOTE — Telephone Encounter (Signed)
Patient notified via phone of lab results.

## 2012-10-11 ENCOUNTER — Ambulatory Visit: Payer: PRIVATE HEALTH INSURANCE | Admitting: Internal Medicine

## 2012-11-08 ENCOUNTER — Ambulatory Visit (INDEPENDENT_AMBULATORY_CARE_PROVIDER_SITE_OTHER): Payer: 59 | Admitting: Internal Medicine

## 2012-11-08 ENCOUNTER — Encounter: Payer: Self-pay | Admitting: Internal Medicine

## 2012-11-08 VITALS — BP 128/78 | HR 66 | Temp 98.2°F | Resp 16 | Wt 217.2 lb

## 2012-11-08 DIAGNOSIS — E119 Type 2 diabetes mellitus without complications: Secondary | ICD-10-CM

## 2012-11-08 DIAGNOSIS — E669 Obesity, unspecified: Secondary | ICD-10-CM

## 2012-11-08 DIAGNOSIS — H269 Unspecified cataract: Secondary | ICD-10-CM

## 2012-11-08 NOTE — Patient Instructions (Addendum)
WE need you to have a fasting lipid panel,  CMET , urine protein test in 6 weeks. Ask HR if you will pay more if the labs are done at our office.   This is  my version of a  "Low GI"  Diet:  All of the foods can be found at grocery stores and in bulk at [employer].  The Atkins protein bars and shakes are available in more varieties at Target, WalMart and [employer].     7 AM Breakfast:  Low carbohydrate Protein  Shakes (I recommend the EAS AdvantEdge "Carb Control" shakes  Or the low carb shakes by Atkins.   Both are available everywhere:  In  cases at BJs  Or in 4 packs at grocery stores and pharmacies  2.5 carbs  (Alternative is  a toasted Arnold's Sandwhich Thin w/ peanut butter, a "Bagel Thin" with cream cheese and salmon) or  a scrambled egg burrito made with a low carb tortilla .  Avoid cereal and bananas, oatmeal too unless you are cooking the old fashioned kind that takes 30-40 minutes to prepare.  the rest is overly processed, has minimal fiber, and is loaded with carbohydrates!   10 AM: Protein bar by Atkins (the snack size, under 200 cal).  There are many varieties , available widely again or in bulk in limited varieties at BJs)  Other so called "protein bars" tend to be loaded with carbohydrates.  Remember, in food advertising, the word "energy" is synonymous for " carbohydrate."  Lunch: sandwich of [place], (or any lunchmeat, grilled meat or canned tuna), fresh avocado, mayonnaise  and cheese on a lower carbohydrate pita bread, flatbread, or tortilla . Ok to use regular mayonnaise. The bread is the only source or carbohydrate that can be decreased (Joseph's makes a pita bread and a flat bread that are 50 cal and 4 net carbs ; Toufayan makes a low carb flatbread that's 100 cal and 9 net carbs  and  Mission makes a low carb whole wheat tortilla  That is 210 cal and 6 net carbs)  3 PM:  Mid day :  Another protein bar,  Or a  cheese stick (100 cal, 0 carbs),  Or 1 ounce of  almonds, walnuts,  pistachios, pecans, peanuts,  Macadamia nuts. Or a Dannon light n Fit greek yogurt, 80 cal 8 net carbs . Avoid "granola"; the dried cranberries and raisins are loaded with carbohydrates. Mixed nuts ok if no raisins or cranberries or dried fruit.    6 PM  Dinner:  "mean and green:"  Meat/chicken/fish or a high protein legume; , with a green salad, and a low GI  Veggie (broccoli, cauliflower, green beans, spinach, brussel sprouts. Lima beans) : Avoid "Low fat dressings, as well as Reyne Dumas and [address]! They are loaded with sugar! Instead use ranch, vinagrette,  Blue cheese, etc.  There is a low carb pasta by Dreamfield's available at [facility] that is acceptable and tastes great. Try Michel Angel's chicken piccata over low carb pasta. The chicken dish is 0 carbs, and can be found in frozen section at BJs and [employer]. Also try [employer] "Carnitas" (pulled pork, no sauce,  0 carbs) and his pot roast.   both are in the refrigerated section at BJs.   Dreamfield's has a low carb semolina pasta 5 /serving available at [employer] .    9 PM snack : Breyer's "low carb" fudgsicle or  ice cream bar (Carb Smart line), or  Weight Watcher's ice cream bar , or another "no sugar added" ice cream;a serving of fresh berries/cherries with whipped cream (Avoid bananas, pineapple, grapes  and watermelon on a regular basis because they are high in sugar)   Remember that snack Substitutions should be less than 15 to 20 carbs  Per serving. Remember to subtract fiber grams and sugar alcohols to get the "net carbs."

## 2012-11-08 NOTE — Progress Notes (Signed)
Patient ID: Kimberly Brady, female   DOB: 1951-10-06, 62 y.o.   MRN: [id number]   Patient Active Problem List  Diagnosis  . Hypertension  . Diabetes mellitus type 2 in obese  . Bilateral cataracts  . Hyperlipidemia LDL goal < 70  . 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 Obesity (BMI 30-39.9)    Subjective:  CC:   Chief Complaint  Patient presents with  . Follow-up    HPI:   Kimberly Brady a 62 y.o. female who presents Follow up on diabetes mellitus, hyperlipidemia, and obesity.  She has gained several pounds because of the holidays and has no formal dietary plan or exercise planShe feels generally well, has no joint complaints, chst pian or shortness of breath.  Blood sugars remain well controlled on current regimen. Denies nausea, vomiting or diarrhea.    Past Medical History  Diagnosis Date  . Hypertension   . Diabetes mellitus     type 2  . Obesity (BMI 30-39.9)     Past Surgical History  Procedure Date  . Total abdominal hysterectomy w/ bilateral salpingoophorectomy 1986    for polycystic ovaries,endometriosis  . Cholecystectomy 2009    lapaoscopic         The following portions of the patient's history were reviewed and updated as appropriate: Allergies, current medications, and problem list.    Review of Systems:   12 Pt  review of systems was negative except those addressed in the HPI,     History   Social History  . Marital Status: Married    Spouse Name: N/A    Number of Children: N/A  . Years of Education: N/A   Occupational History  . Not on file.   Social History Main Topics  . Smoking status: Never Smoker   . Smokeless tobacco: Never Used  . Alcohol Use: No  . Drug Use: No  . Sexually Active: Not on file   Other Topics Concern  . Not on file   Social History Narrative   THERE ARE LAB RESULTS IN THE PAPER CHART BUT NOT UPDATED IN COMPUTER SYSTEM AS I WAS UNABLE TO READ THE RESULTS ACCURATELY...    Objective:  BP 128/78   Pulse 66  Temp 98.2 F (36.8 C) (Oral)  Resp 16  Wt 217 lb 4 oz (98.544 kg)  SpO2 95%  General appearance: alert, cooperative and appears stated age Ears: normal TM's and external ear canals both ears Throat: lips, mucosa, and tongue normal; teeth and gums normal Neck: no adenopathy, no carotid bruit, supple, symmetrical, trachea midline and thyroid not enlarged, symmetric, no tenderness/mass/nodules Back: symmetric, no curvature. ROM normal. No CVA tenderness. Lungs: clear to auscultation bilaterally Heart: regular rate and rhythm, S1, S2 normal, no murmur, click, rub or gallop Abdomen: soft, non-tender; bowel sounds normal; no masses,  no organomegaly Pulses: 2+ and symmetric Skin: Skin color, texture, turgor normal. No rashes or lesions Lymph nodes: Cervical, supraclavicular, and axillary nodes normal.  Assessment and Plan:  Diabetes mellitus type 2 in obese Well controlled historically on Victoza .  Up to dtae on eye exams due to history of cataracts. Foot exam normal today / low GI diet and regular aerobic exercise recommended.   Obesity (BMI 30-39.9) Her BMI remains unchanged despite repeated recommendationsWe discussed the nature and quality of her exercises well as of her diet. Usually what I find is that people are not exercising as vigorously as they should to achieve a sustained heart rate in the  aerobic zone. I suggested that she consider joining a gym and using a personal trainer to help guide her efforts.   I also am advising her to get back on the low GI diet using six smaller meals a day to stimulate her metabolism.     Updated Medication List Outpatient Encounter Prescriptions as of 11/08/2012  Medication Sig Dispense Refill  . Liraglutide (VICTOZA) 18 MG/3ML SOLN Inject 1.8 mg subcutaneous daily  27 mL  3  . Red Yeast Rice Extract 600 MG CAPS Take 600 mg by mouth 2 (two) times daily.           No orders of the defined types were placed in this encounter.    No  Follow-up on file.

## 2012-11-10 DIAGNOSIS — E669 Obesity, unspecified: Secondary | ICD-10-CM | POA: Insufficient documentation

## 2012-11-10 NOTE — Assessment & Plan Note (Signed)
Her BMI remains unchanged despite repeated recommendationsWe discussed the nature and quality of her exercises well as of her diet. Usually what I find is that people are not exercising as vigorously as they should to achieve a sustained heart rate in the aerobic zone. I suggested that she consider joining a gym and using a personal trainer to help guide her efforts.   I also am advising her to get back on the low GI diet using six smaller meals a day to stimulate her metabolism.

## 2012-11-10 NOTE — Assessment & Plan Note (Addendum)
Well controlled historically on Victoza .  Up to dtae on eye exams due to history of cataracts. Foot exam normal today / low GI diet and regular aerobic exercise recommended.

## 2013-01-20 ENCOUNTER — Encounter: Payer: Self-pay | Admitting: General Practice

## 2013-01-20 ENCOUNTER — Telehealth: Payer: Self-pay | Admitting: Internal Medicine

## 2013-01-20 NOTE — Telephone Encounter (Signed)
Her hemoglobin A1c was excellent at 6.3.

## 2013-01-20 NOTE — Telephone Encounter (Signed)
Letter mailed to pt.  

## 2013-01-21 LAB — HM MAMMOGRAPHY: HM Mammogram: NORMAL

## 2013-01-22 ENCOUNTER — Telehealth: Payer: Self-pay | Admitting: General Practice

## 2013-01-22 NOTE — Telephone Encounter (Signed)
Pt called stating that she had read new studies that she should be due for 2 doses of the MMR due to recent labs not showing she is immune to Mumps. Pt would like your thoughts on this due to her age and possible side effects. Please advise.

## 2013-01-22 NOTE — Telephone Encounter (Signed)
If she has had recent labs showing no immunity to mumps, then I absolutely agree that she needs to be revaccinated with two doses as long as she does not have an egg allergy. Her age is not a problem.    Adverse reactions are rare on the second time around but the following have been reported:  Fever, arthralgias, rash,  Swollen lymph nodes. Very rare cases of Seizures, and Low platelets

## 2013-01-23 NOTE — Telephone Encounter (Signed)
Pt stated she already received her first MMR and is due for the second in a month.

## 2013-02-14 ENCOUNTER — Encounter: Payer: Self-pay | Admitting: Internal Medicine

## 2013-02-14 ENCOUNTER — Ambulatory Visit (INDEPENDENT_AMBULATORY_CARE_PROVIDER_SITE_OTHER): Payer: 59 | Admitting: Internal Medicine

## 2013-02-14 VITALS — BP 128/78 | HR 76 | Temp 98.7°F | Resp 16 | Wt 217.0 lb

## 2013-02-14 DIAGNOSIS — E669 Obesity, unspecified: Secondary | ICD-10-CM

## 2013-02-14 DIAGNOSIS — E119 Type 2 diabetes mellitus without complications: Secondary | ICD-10-CM

## 2013-02-14 DIAGNOSIS — Z1239 Encounter for other screening for malignant neoplasm of breast: Secondary | ICD-10-CM

## 2013-02-14 DIAGNOSIS — E785 Hyperlipidemia, unspecified: Secondary | ICD-10-CM

## 2013-02-14 DIAGNOSIS — I10 Essential (primary) hypertension: Secondary | ICD-10-CM

## 2013-02-14 DIAGNOSIS — E1169 Type 2 diabetes mellitus with other specified complication: Secondary | ICD-10-CM

## 2013-02-14 MED ORDER — LIRAGLUTIDE 18 MG/3ML ~~LOC~~ SOLN: SUBCUTANEOUS | Status: DC

## 2013-02-14 NOTE — Patient Instructions (Addendum)
You are doing fine!    Send me your cabin information   [email or web address]    We should recheck your liver and kidney function in the next 3 months   Try the [facility] frozen banana treat

## 2013-02-15 ENCOUNTER — Encounter: Payer: Self-pay | Admitting: Internal Medicine

## 2013-02-15 NOTE — Assessment & Plan Note (Signed)
Well-controlled on current medication (victoza).  hemoglobin A1c has been consistently less than 7.0 . She is up-to-date on eye exams and  foot exam is normal.  She has statin intolerance but tolerates red yeast rice

## 2013-02-15 NOTE — Assessment & Plan Note (Signed)
Managed with [facility].  Avoiding statin due to history of pancreatitis.

## 2013-02-15 NOTE — Progress Notes (Signed)
Patient ID: Kimberly Brady, female   DOB: 05-10-1951, 62 y.o.   MRN: [id number]  Patient Active Problem List  Diagnosis  . Hypertension  . Diabetes mellitus type 2 in obese  . Bilateral cataracts  . Hyperlipidemia LDL goal < 70  . 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 Obesity (BMI 30-39.9)    Subjective:  CC:   Chief Complaint  Patient presents with  . Follow-up    3 month    HPI:   Kimberly Brady a 62 y.o. female who presents for  3 month follow up on diabetes mellitus, hypertension and obesity.She has not had labs done because her new insurance does not reimburse  And she cannot afford the projected $500.00 for CMET and lipids.  [facility]. She has been walking several times per week  And following a healthy diet but has not lost any weight since her last visit.  She denies abdominal pain , muscle pain, changes in bowel habits, and low blood sugars.    Past Medical History  Diagnosis Date  . Hypertension   . Diabetes mellitus     type 2  . Obesity (BMI 30-39.9)     Past Surgical History  Procedure Laterality Date  . Total abdominal hysterectomy w/ bilateral salpingoophorectomy  1986    for polycystic ovaries,endometriosis  . Cholecystectomy  2009    lapaoscopic     The following portions of the patient's history were reviewed and updated as appropriate: Allergies, current medications, and problem list.    Review of Systems:   Patient denies headache, fevers, malaise, unintentional weight loss, skin rash, eye pain, sinus congestion and sinus pain, sore throat, dysphagia,  hemoptysis , cough, dyspnea, wheezing, chest pain, palpitations, orthopnea, edema, abdominal pain, nausea, melena, diarrhea, constipation, flank pain, dysuria, hematuria, urinary  Frequency, nocturia, numbness, tingling, seizures,  Focal weakness, Loss of consciousness,  Tremor, insomnia, depression, anxiety, and suicidal ideation.     History   Social  History  . Marital Status: Married    Spouse Name: N/A    Number of Children: N/A  . Years of Education: N/A   Occupational History  . Not on file.   Social History Main Topics  . Smoking status: Never Smoker   . Smokeless tobacco: Never Used  . Alcohol Use: No  . Drug Use: No  . Sexually Active: Not on file   Other Topics Concern  . Not on file   Social History Narrative   THERE ARE LAB RESULTS IN THE PAPER CHART BUT NOT UPDATED IN COMPUTER SYSTEM AS I WAS UNABLE TO READ THE RESULTS ACCURATELY...             Objective:  BP 128/78  Pulse 76  Temp(Src) 98.7 F (37.1 C) (Oral)  Resp 16  Wt 217 lb (98.431 kg)  BMI 38.45 kg/m2  SpO2 98%  General appearance: alert, cooperative and appears stated age Ears: normal TM's and external ear canals both ears Throat: lips, mucosa, and tongue normal; teeth and gums normal Neck: no adenopathy, no carotid bruit, supple, symmetrical, trachea midline and thyroid not enlarged, symmetric, no tenderness/mass/nodules Back: symmetric, no curvature. ROM normal. No CVA tenderness. Lungs: clear to auscultation bilaterally Heart: regular rate and rhythm, S1, S2 normal, no murmur, click, rub or gallop Abdomen: soft, non-tender; bowel sounds normal; no masses,  no organomegaly Pulses: 2+ and symmetric Skin: Skin color, texture, turgor normal. No rashes or lesions Lymph nodes: Cervical,  supraclavicular, and axillary nodes normal.  Assessment and Plan:  Diabetes mellitus type 2 in obese Well-controlled on current medication (victoza).  hemoglobin A1c has been consistently less than 7.0 . She is up-to-date on eye exams and  foot exam is normal.  She has statin intolerance but tolerates red yeast rice   Obesity (BMI 30-39.9) Her BMI remains unchanged despite reporting regular exercise. We discussed the nature and quality of her exercises well as of her diet. Usually what I find is that people are not exercising as vigorously as they should to  achieve a sustained heart rate in the aerobic zone. I suggested that she consider joining a gym and using a personal trainer to help guide her efforts.   I also am advising her to get back on the low GI diet using six smaller meals a day to stimulate her metabolism.    Hyperlipidemia LDL goal < 70 Managed with [facility].  Avoiding statin due to history of pancreatitis.   Hypertension Well controlled on current regimen. , no changes today.   Updated Medication List Outpatient Encounter Prescriptions as of 02/14/2013  Medication Sig Dispense Refill  . Liraglutide (VICTOZA) 18 MG/3ML SOLN injection Inject 1.8 mg subcutaneous daily  27 mL  3  . Red Yeast Rice Extract 600 MG CAPS Take 600 mg by mouth 2 (two) times daily.        . [DISCONTINUED] Liraglutide (VICTOZA) 18 MG/3ML SOLN Inject 1.8 mg subcutaneous daily  27 mL  3   No facility-administered encounter medications on file as of 02/14/2013.     Orders Placed This Encounter  Procedures  . MM Digital Screening    No Follow-up on file.

## 2013-02-15 NOTE — Assessment & Plan Note (Signed)
Her BMI remains unchanged despite reporting regular exercise. We discussed the nature and quality of her exercises well as of her diet. Usually what I find is that people are not exercising as vigorously as they should to achieve a sustained heart rate in the aerobic zone. I suggested that she consider joining a gym and using a personal trainer to help guide her efforts.   I also am advising her to get back on the low GI diet using six smaller meals a day to stimulate her metabolism.

## 2013-02-15 NOTE — Assessment & Plan Note (Signed)
Well controlled on current regimen, no changes today. 

## 2013-02-25 ENCOUNTER — Ambulatory Visit: Payer: Self-pay | Admitting: Internal Medicine

## 2013-03-21 ENCOUNTER — Encounter: Payer: Self-pay | Admitting: Internal Medicine

## 2013-04-29 ENCOUNTER — Ambulatory Visit: Payer: Self-pay | Admitting: Internal Medicine

## 2013-04-29 LAB — COMPREHENSIVE METABOLIC PANEL
Alkaline Phosphatase: 77 U/L (ref 50–136)
Anion Gap: 11 (ref 7–16)
BUN: 14 mg/dL (ref 7–18)
Bilirubin,Total: 0.6 mg/dL (ref 0.2–1.0)
Co2: 27 mmol/L (ref 21–32)
Creatinine: 0.85 mg/dL (ref 0.60–1.30)
EGFR (African American): >60
Glucose: 119 mg/dL — ABNORMAL HIGH (ref 65–99)
Potassium: 4.4 mmol/L (ref 3.5–5.1)
Sodium: 142 mmol/L (ref 136–145)

## 2013-04-29 LAB — LIPID PANEL
Cholesterol: 157 mg/dL (ref 0–200)
Ldl Cholesterol, Calc: 81 mg/dL (ref 0–100)
VLDL Cholesterol, Calc: 31 mg/dL (ref 5–40)

## 2013-04-30 ENCOUNTER — Encounter: Payer: Self-pay | Admitting: Internal Medicine

## 2013-04-30 ENCOUNTER — Telehealth: Payer: Self-pay | Admitting: Internal Medicine

## 2013-04-30 LAB — LIPID PANEL
HDL: 45 mg/dL (ref 35–70)
LDL Cholesterol: 81 mg/dL

## 2013-04-30 NOTE — Telephone Encounter (Signed)
All labs normal,. Including a1c which is 6.3 and cholesterol (total 153,  LDl 81)

## 2013-05-01 NOTE — Telephone Encounter (Signed)
Called and advised patient of results

## 2013-05-06 ENCOUNTER — Telehealth: Payer: Self-pay | Admitting: Internal Medicine

## 2013-05-15 ENCOUNTER — Ambulatory Visit: Payer: 59 | Admitting: Internal Medicine

## 2013-05-16 ENCOUNTER — Ambulatory Visit: Payer: 59 | Admitting: Internal Medicine

## 2013-05-23 ENCOUNTER — Encounter: Payer: Self-pay | Admitting: Internal Medicine

## 2013-05-23 ENCOUNTER — Ambulatory Visit (INDEPENDENT_AMBULATORY_CARE_PROVIDER_SITE_OTHER): Payer: 59 | Admitting: Internal Medicine

## 2013-05-23 VITALS — BP 134/78 | HR 87 | Temp 98.4°F | Resp 14 | Wt 217.8 lb

## 2013-05-23 DIAGNOSIS — I10 Essential (primary) hypertension: Secondary | ICD-10-CM

## 2013-05-23 DIAGNOSIS — E785 Hyperlipidemia, unspecified: Secondary | ICD-10-CM

## 2013-05-23 DIAGNOSIS — E119 Type 2 diabetes mellitus without complications: Secondary | ICD-10-CM

## 2013-05-23 DIAGNOSIS — E669 Obesity, unspecified: Secondary | ICD-10-CM

## 2013-05-23 LAB — HM DIABETES FOOT EXAM: HM Diabetic Foot Exam: NORMAL

## 2013-05-23 MED ORDER — GLUCOSE BLOOD VI STRP: ORAL_STRIP | Status: DC

## 2013-05-23 NOTE — Patient Instructions (Signed)
Suspend victiza for 2 weeks  Start checking  sugars once daily at different time  Goal fasting bs is 80 to 120,    2 hr post prandial 80 to 150   Resume victoza if sugars are climbing above these numbers

## 2013-05-23 NOTE — Progress Notes (Signed)
Patient ID: Kimberly Brady, female   DOB: 10-14-1951, 61 y.o.   MRN: [id number]   Patient Active Problem List   Diagnosis Date Noted  . Obesity (BMI 30-39.9) 11/10/2012  . 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 Bilateral cataracts 09/24/2011  . Hyperlipidemia LDL goal < 70 09/24/2011  . Hypertension 06/14/2011  . Diabetes mellitus type 2 in obese 06/14/2011    Subjective:  CC:   Chief Complaint  Patient presents with  . Follow-up    HPI:   Kimberly Brady a 62 y.o. female who presents 3 month follow  Up on diabetes, hypertension, hyperlipidemia and obesity. Has been using red yeast rice for cholesterol management,  And victoza intermittently  With no change in blood sugars after several days without it.  Current aic  6.3 .  No new issues.  Has not lost any weight,  Has a healthy diet,  Walks regularly, but does not engage in regular cardioaerobic exercise. Works full time.    Past Medical History  Diagnosis Date  . Hypertension   . Diabetes mellitus     type 2  . Obesity (BMI 30-39.9)     Past Surgical History  Procedure Laterality Date  . Total abdominal hysterectomy w/ bilateral salpingoophorectomy  1986    for polycystic ovaries,endometriosis  . Cholecystectomy  2009    lapaoscopic       The following portions of the patient's history were reviewed and updated as appropriate: Allergies, current medications, and problem list.    Review of Systems:   Patient denies headache, fevers, malaise, unintentional weight loss, skin rash, eye pain, sinus congestion and sinus pain, sore throat, dysphagia,  hemoptysis , cough, dyspnea, wheezing, chest pain, palpitations, orthopnea, edema, abdominal pain, nausea, melena, diarrhea, constipation, flank pain, dysuria, hematuria, urinary  Frequency, nocturia, numbness, tingling, seizures,  Focal weakness, Loss of consciousness,  Tremor, insomnia, depression, anxiety, and suicidal ideation.     History   Social History   . Marital Status: Married    Spouse Name: N/A    Number of Children: N/A  . Years of Education: N/A   Occupational History  . Not on file.   Social History Main Topics  . Smoking status: Never Smoker   . Smokeless tobacco: Never Used  . Alcohol Use: No  . Drug Use: No  . Sexually Active: Not on file   Other Topics Concern  . Not on file   Social History Narrative   THERE ARE LAB RESULTS IN THE PAPER CHART BUT NOT UPDATED IN COMPUTER SYSTEM AS I WAS UNABLE TO READ THE RESULTS ACCURATELY...             Objective:  BP 134/78  Pulse 87  Temp(Src) 98.4 F (36.9 C) (Oral)  Resp 14  Wt 217 lb 12 oz (98.771 kg)  BMI 38.58 kg/m2  SpO2 98%  General appearance: alert, cooperative and appears stated age Ears: normal TM's and external ear canals both ears Throat: lips, mucosa, and tongue normal; teeth and gums normal Neck: no adenopathy, no carotid bruit, supple, symmetrical, trachea midline and thyroid not enlarged, symmetric, no tenderness/mass/nodules Back: symmetric, no curvature. ROM normal. No CVA tenderness. Lungs: clear to auscultation bilaterally Heart: regular rate and rhythm, S1, S2 normal, no murmur, click, rub or gallop Abdomen: soft, non-tender; bowel sounds normal; no masses,  no organomegaly Pulses: 2+ and symmetric Skin: Skin color, texture, turgor normal. No rashes or lesions Lymph nodes: Cervical, supraclavicular, and axillary nodes normal. Foot exam:  Nails are well trimmed,  No callouses,  Sensation intact to microfilament   Assessment and Plan:  Diabetes mellitus type 2 in obese Well-controlled on Victoza alone , with intermittent use seeing no difference in blood sugars,  hemoglobin A1c has been consistently less than 7.0 .  She is due for annual  eye exams and her foot exam is normal. Using RYR due to history of prior statin intolerance..  Discussed stopping victoza for 3 months, she it has not helped her lose weight   Hypertension Well controlled  on current regimen. Renal function stable, no changes today.  Hyperlipidemia LDL goal < 70 LDL is 81 on RYR.  No changes today  Obesity (BMI 30-39.9) Her BMI remains unchanged despite regular walking. We discussed the nature and quality of her exercises well as of her diet. Usually what I find is that people are not exercising as vigorously as they should to achieve a sustained heart rate in the aerobic zone. I have made suggested that she consider joining a gym and using a personal trainer to help guide her efforts.     Updated Medication List Outpatient Encounter Prescriptions as of 05/23/2013  Medication Sig Dispense Refill  . Liraglutide (VICTOZA) 18 MG/3ML SOLN injection Inject 1.8 mg subcutaneous daily  27 mL  3  . Red Yeast Rice Extract 600 MG CAPS Take 600 mg by mouth 2 (two) times daily.        Marland Kitchen glucose blood test strip Use as instructed  100 each  12  . [DISCONTINUED] glucose blood test strip Use as instructed  100 each  12   No facility-administered encounter medications on file as of 05/23/2013.     Orders Placed This Encounter  Procedures  . HM MAMMOGRAPHY  . HM DIABETES FOOT EXAM  . HM DIABETES EYE EXAM    No Follow-up on file.

## 2013-05-25 ENCOUNTER — Encounter: Payer: Self-pay | Admitting: Internal Medicine

## 2013-05-25 NOTE — Assessment & Plan Note (Signed)
LDL is 81 on RYR.  No changes today

## 2013-05-25 NOTE — Assessment & Plan Note (Signed)
Well controlled on current regimen. Renal function stable, no changes today. 

## 2013-05-25 NOTE — Assessment & Plan Note (Signed)
Well-controlled on Victoza alone , with intermittent use seeing no difference in blood sugars,  hemoglobin A1c has been consistently less than 7.0 .  She is due for annual  eye exams and her foot exam is normal. Using RYR due to history of prior statin intolerance..  Discussed stopping victoza for 3 months, she it has not helped her lose weight

## 2013-05-25 NOTE — Assessment & Plan Note (Signed)
Her BMI remains unchanged despite regular walking. We discussed the nature and quality of her exercises well as of her diet. Usually what I find is that people are not exercising as vigorously as they should to achieve a sustained heart rate in the aerobic zone. I have made suggested that she consider joining a gym and using a personal trainer to help guide her efforts.

## 2013-06-02 ENCOUNTER — Encounter: Payer: Self-pay | Admitting: Internal Medicine

## 2013-06-10 ENCOUNTER — Telehealth: Payer: Self-pay | Admitting: Internal Medicine

## 2013-06-10 DIAGNOSIS — E119 Type 2 diabetes mellitus without complications: Secondary | ICD-10-CM

## 2013-06-10 NOTE — Telephone Encounter (Signed)
Pt states that at her last visit Dr. Darrick Huntsman was going to call in her test strips but her pharmacy never received those. Pt uses [facility] pharmacy.

## 2013-06-19 MED ORDER — GLUCOSE BLOOD VI STRP: ORAL_STRIP | Status: DC

## 2013-06-19 NOTE — Telephone Encounter (Signed)
Refill sent 05/23/13

## 2013-07-29 LAB — HEMOGLOBIN A1C: Hgb A1c MFr Bld: 6.7 % — AB (ref 4.0–6.0)

## 2013-08-29 ENCOUNTER — Ambulatory Visit: Payer: 59 | Admitting: Internal Medicine

## 2013-09-05 ENCOUNTER — Ambulatory Visit: Payer: 59 | Admitting: Internal Medicine

## 2013-09-12 ENCOUNTER — Ambulatory Visit (INDEPENDENT_AMBULATORY_CARE_PROVIDER_SITE_OTHER): Payer: 59 | Admitting: Internal Medicine

## 2013-09-12 ENCOUNTER — Encounter: Payer: Self-pay | Admitting: Internal Medicine

## 2013-09-12 VITALS — BP 124/78 | HR 63 | Temp 98.1°F | Resp 12 | Ht 63.0 in | Wt 217.2 lb

## 2013-09-12 DIAGNOSIS — Z789 Other specified health status: Secondary | ICD-10-CM

## 2013-09-12 DIAGNOSIS — E119 Type 2 diabetes mellitus without complications: Secondary | ICD-10-CM

## 2013-09-12 DIAGNOSIS — E1169 Type 2 diabetes mellitus with other specified complication: Secondary | ICD-10-CM

## 2013-09-12 DIAGNOSIS — M21619 Bunion of unspecified foot: Secondary | ICD-10-CM

## 2013-09-12 DIAGNOSIS — E669 Obesity, unspecified: Secondary | ICD-10-CM

## 2013-09-12 DIAGNOSIS — I10 Essential (primary) hypertension: Secondary | ICD-10-CM

## 2013-09-12 DIAGNOSIS — M21612 Bunion of left foot: Secondary | ICD-10-CM | POA: Insufficient documentation

## 2013-09-12 DIAGNOSIS — E785 Hyperlipidemia, unspecified: Secondary | ICD-10-CM

## 2013-09-12 DIAGNOSIS — Z888 Allergy status to other drugs, medicaments and biological substances status: Secondary | ICD-10-CM

## 2013-09-12 NOTE — Patient Instructions (Addendum)
We will repeat your liver tests in January, ( 6 months from last ) along with the urine test for proteinuria  Please ask the Employee health to send me your last A1c

## 2013-09-14 ENCOUNTER — Encounter: Payer: Self-pay | Admitting: Internal Medicine

## 2013-09-14 DIAGNOSIS — Z789 Other specified health status: Secondary | ICD-10-CM | POA: Insufficient documentation

## 2013-09-14 NOTE — Assessment & Plan Note (Signed)
LDL is 81 on Red yeast rice .  No changes today     

## 2013-09-14 NOTE — Assessment & Plan Note (Signed)
I have addressed  BMI and recommended wt loss of 10% of body weigh over the next 6 months using a low glycemic index diet and regular exercise a minimum of 5 days per week.   

## 2013-09-14 NOTE — Assessment & Plan Note (Signed)
Well-controlled on Victoza alone , with intermittent use seeing no difference in blood sugars,  hemoglobin A1c has been consistently less than 7.0 .  She is due for annual  eye exams and her foot exam is normal. Using RYR due to history of prior statin intolerance..  Discussed stopping victoza for 3 months, she it has not helped her lose weight

## 2013-09-14 NOTE — Progress Notes (Signed)
Patient ID: Kimberly Brady, female   DOB: 1951-01-31, 62 y.o.   MRN: [id number]  Patient Active Problem List   Diagnosis Date Noted  . Statin intolerance 09/14/2013  . Bunion of great toe of left foot 09/12/2013  . Obesity (BMI 30-39.9) 11/10/2012  . 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 Bilateral cataracts 09/24/2011  . Hyperlipidemia LDL goal < 70 09/24/2011  . Hypertension 06/14/2011  . Diabetes mellitus type 2 in obese 06/14/2011    Subjective:  CC:   Chief Complaint  Patient presents with  . Follow-up    3 month    HPI:   Kimberly Brady a 62 y.o. female who presents for  follow up on diabetes mellitus type  With  hyperlipidemia, hypertension and obesity .   She has been using red yeast rice for cholesterol management,  And victoza intermittently   Current aic  6.7.  No new issues.  Has not lost any weight Has a healthy diet,  Walks regularly, but does not engage in regular cardioaerobic exercise. Works full time.    Past Medical History  Diagnosis Date  . Hypertension   . Diabetes mellitus     type 2  . Obesity (BMI 30-39.9)     Past Surgical History  Procedure Laterality Date  . Total abdominal hysterectomy w/ bilateral salpingoophorectomy  1986    for polycystic ovaries,endometriosis  . Cholecystectomy  2009    lapaoscopic       The following portions of the patient's history were reviewed and updated as appropriate: Allergies, current medications, and problem list.    Review of Systems:   12 Pt  review of systems was negative except those addressed in the HPI,     History   Social History  . Marital Status: Married    Spouse Name: N/A    Number of Children: N/A  . Years of Education: N/A   Occupational History  . Not on file.   Social History Main Topics  . Smoking status: Never Smoker   . Smokeless tobacco: Never Used  . Alcohol Use: No  . Drug Use: No  . Sexual Activity: Not on file   Other Topics Concern  . Not on file    Social History Narrative   THERE ARE LAB RESULTS IN THE PAPER CHART BUT NOT UPDATED IN COMPUTER SYSTEM AS I WAS UNABLE TO READ THE RESULTS ACCURATELY...             Objective:  Filed Vitals:   09/12/13 0909  BP: 124/78  Pulse: 63  Temp: 98.1 F (36.7 C)  Resp: 12     General appearance: alert, cooperative and appears stated age Ears: normal TM's and external ear canals both ears Throat: lips, mucosa, and tongue normal; teeth and gums normal Neck: no adenopathy, no carotid bruit, supple, symmetrical, trachea midline and thyroid not enlarged, symmetric, no tenderness/mass/nodules Back: symmetric, no curvature. ROM normal. No CVA tenderness. Lungs: clear to auscultation bilaterally Heart: regular rate and rhythm, S1, S2 normal, no murmur, click, rub or gallop Abdomen: soft, non-tender; bowel sounds normal; no masses,  no organomegaly Pulses: 2+ and symmetric Skin: Skin color, texture, turgor normal. No rashes or lesions Lymph nodes: Cervical, supraclavicular, and axillary nodes normal.  Assessment and Plan:  Bunion of great toe of left foot Managed with use of silicone to spacer.  Foot exam normal  No significant toe deviation .    Hyperlipidemia LDL goal < 70 LDL is 81 on Red  yeast rice .  No changes today    Obesity (BMI 30-39.9) I have addressed  BMI and recommended wt loss of 10% of body weigh over the next 6 months using a low glycemic index diet and regular exercise a minimum of 5 days per week.    Hypertension Well controlled on current regimen. Renal function stable, no changes today.    Diabetes mellitus type 2 in obese Well-controlled on Victoza alone , with intermittent use seeing no difference in blood sugars,  hemoglobin A1c has been consistently less than 7.0 .  She is due for annual  eye exams and her foot exam is normal. Using RYR due to history of prior statin intolerance..  Discussed stopping victoza for 3 months, she it has not helped her lose  weight      Updated Medication List Outpatient Encounter Prescriptions as of 09/12/2013  Medication Sig  . glucose blood test strip Use as instructed  . Liraglutide (VICTOZA) 18 MG/3ML SOLN injection Inject 1.8 mg subcutaneous daily  . Red Yeast Rice Extract 600 MG CAPS Take 600 mg by mouth 2 (two) times daily.       Orders Placed This Encounter  Procedures  . Hemoglobin A1c  . Ambulatory referral to Ophthalmology    No Follow-up on file.

## 2013-09-14 NOTE — Assessment & Plan Note (Signed)
Managed with use of silicone to spacer.  Foot exam normal  No significant toe deviation .

## 2013-09-14 NOTE — Assessment & Plan Note (Signed)
Well controlled on current regimen. Renal function stable, no changes today. 

## 2013-10-20 LAB — HM DIABETES EYE EXAM

## 2013-11-03 LAB — HEMOGLOBIN A1C: HEMOGLOBIN A1C: 6.4 % — AB (ref 4.0–6.0)

## 2013-11-09 ENCOUNTER — Telehealth: Payer: Self-pay | Admitting: Internal Medicine

## 2013-12-19 ENCOUNTER — Ambulatory Visit: Payer: 59 | Admitting: Internal Medicine

## 2014-01-02 ENCOUNTER — Ambulatory Visit: Payer: 59 | Admitting: Internal Medicine

## 2014-01-07 ENCOUNTER — Telehealth: Payer: Self-pay | Admitting: Internal Medicine

## 2014-01-07 NOTE — Telephone Encounter (Signed)
Pt called stating she received a bill from 07/19/12 for $120.   She talked to the billing dept the billing sent her medcost  medcost  Rejected this bill   medcost told her it was not billed as follow up or office visit.  The billed was filed as obseity visit Pt stated she has never seen dr Derrel Nip for this The insurance stated that this needed to be recoded and sent back to be processed  She has new insurance as [employer] stated it had to be refiled under her new umr through [employer]. Pt works at [employer] stated there was also another bill for same code for 145 dated 7/?/14 Can you help her with this also

## 2014-01-08 NOTE — Telephone Encounter (Signed)
Kimberly Brady  Who do i need to contact about this

## 2014-01-11 NOTE — Telephone Encounter (Signed)
I looked at the 07/19/12 visit and she was billed an office visit [id number]. They also discussed obesity in her visit since her BMI was not where it should be. Is she saying that both bills need to be billed under UMR? If so when did she get UMR because if it was after the 07/19/12 visit we can't charge the bill to them. I don't see a bill for 04/2013 I see one for 05/23/2013. Which again states patient here for follow up on diabetes, obesity, and hypertension and hyperlipidemia.If she had UMR for that date of service. I have also sent Manuela Schwartz an email asking her to look at both dates.

## 2014-01-12 NOTE — Telephone Encounter (Signed)
She works for [employer] So when [employer] took over is when she got umr.  She stated it was ordinally billed to med cost but was told there was no more funds from med cost and that the visit would need to be billed to [facility].   When she called she stated she has never see dr Derrel Nip for obseity.  She stated that both would need to be sent to [facility]

## 2014-01-13 NOTE — Telephone Encounter (Signed)
I called and advised patient that they were going to re-file DOS 07/19/2012. She was very thankful for [place] looking into this for her.

## 2014-01-13 NOTE — Telephone Encounter (Signed)
Hi Shannon, I looked at above patients account and notes, I will send 07/19/12 for charge correction.  We will switch the diagnosis to diabetes as primary and obesity as secondary and also add the BMI index code.  Dr. Derrel Nip did discuss weight and exercise with her and suggested a detailed diet.  DOS 05/23/13 has a zero balance, looks like it was paid.    Hope this helps.  Aron Baba, CPC-A

## 2014-01-16 ENCOUNTER — Ambulatory Visit (INDEPENDENT_AMBULATORY_CARE_PROVIDER_SITE_OTHER): Payer: 59 | Admitting: Internal Medicine

## 2014-01-16 ENCOUNTER — Encounter (INDEPENDENT_AMBULATORY_CARE_PROVIDER_SITE_OTHER): Payer: Self-pay

## 2014-01-16 ENCOUNTER — Encounter: Payer: Self-pay | Admitting: Internal Medicine

## 2014-01-16 VITALS — BP 118/70 | HR 67 | Temp 98.7°F | Resp 16 | Wt 218.8 lb

## 2014-01-16 DIAGNOSIS — E1169 Type 2 diabetes mellitus with other specified complication: Secondary | ICD-10-CM

## 2014-01-16 DIAGNOSIS — E669 Obesity, unspecified: Secondary | ICD-10-CM

## 2014-01-16 DIAGNOSIS — E785 Hyperlipidemia, unspecified: Secondary | ICD-10-CM

## 2014-01-16 DIAGNOSIS — I10 Essential (primary) hypertension: Secondary | ICD-10-CM

## 2014-01-16 DIAGNOSIS — E119 Type 2 diabetes mellitus without complications: Secondary | ICD-10-CM

## 2014-01-16 LAB — HM DIABETES FOOT EXAM: HM Diabetic Foot Exam: NORMAL

## 2014-01-16 MED ORDER — LIRAGLUTIDE 18 MG/3ML ~~LOC~~ SOPN: 1.8000 mg | PEN_INJECTOR | Freq: Every day | SUBCUTANEOUS | Status: DC

## 2014-01-16 NOTE — Progress Notes (Signed)
Pre-visit discussion using our clinic review tool. No additional management support is needed unless otherwise documented below in the visit note.  

## 2014-01-16 NOTE — Progress Notes (Signed)
Patient ID: Kimberly Brady, female   DOB: 1951-08-11, 63 y.o.   MRN: [id number]  \ Patient Active Problem List   Diagnosis Date Noted  . Statin intolerance 09/14/2013  . Bunion of great toe of left foot 09/12/2013  . Obesity (BMI 30-39.9) 11/10/2012  . 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 Bilateral cataracts 09/24/2011  . Hyperlipidemia LDL goal < 70 09/24/2011  . Hypertension 06/14/2011  . Diabetes mellitus type 2 in obese 06/14/2011    Subjective:  CC:   Chief Complaint  Patient presents with  . Follow-up  . Diabetes    HPI:   Kimberly Brady is a 63 y.o. female who presents for Follow up on DM type 2,  Hypertension, hyperlipidemia  and obesity.  Blood sugars in normal range virtually 100% of the time with one low blood sugar late one afternoon.     Last eye exam was normal :  No retinopathy, no rx change. No glaucoma  Dr Renato Battles,  Has done her cataracts and has been her eye MD for years.    Past Medical History  Diagnosis Date  . Hypertension   . Diabetes mellitus     type 2  . Obesity (BMI 30-39.9)     Past Surgical History  Procedure Laterality Date  . Total abdominal hysterectomy w/ bilateral salpingoophorectomy  1986    for polycystic ovaries,endometriosis  . Cholecystectomy  2009    lapaoscopic       The following portions of the patient's history were reviewed and updated as appropriate: Allergies, current medications, and problem list.    Review of Systems:   Patient denies headache, fevers, malaise, unintentional weight loss, skin rash, eye pain, sinus congestion and sinus pain, sore throat, dysphagia,  hemoptysis , cough, dyspnea, wheezing, chest pain, palpitations, orthopnea, edema, abdominal pain, nausea, melena, diarrhea, constipation, flank pain, dysuria, hematuria, urinary  Frequency, nocturia, numbness, tingling, seizures,  Focal weakness, Loss of consciousness,  Tremor, insomnia, depression, anxiety, and suicidal ideation.      History   Social History  . Marital Status: Married    Spouse Name: N/A    Number of Children: N/A  . Years of Education: N/A   Occupational History  . Not on file.   Social History Main Topics  . Smoking status: Never Smoker   . Smokeless tobacco: Never Used  . Alcohol Use: No  . Drug Use: No  . Sexual Activity: Not on file   Other Topics Concern  . Not on file   Social History Narrative   THERE ARE LAB RESULTS IN THE PAPER CHART BUT NOT UPDATED IN COMPUTER SYSTEM AS I WAS UNABLE TO READ THE RESULTS ACCURATELY...             Objective:  Filed Vitals:   01/16/14 0856  BP: 118/70  Pulse: 67  Temp: 98.7 F (37.1 C)  Resp: 16     General appearance: alert, cooperative and appears stated age Ears: normal TM's and external ear canals both ears Throat: lips, mucosa, and tongue normal; teeth and gums normal Neck: no adenopathy, no carotid bruit, supple, symmetrical, trachea midline and thyroid not enlarged, symmetric, no tenderness/mass/nodules Back: symmetric, no curvature. ROM normal. No CVA tenderness. Lungs: clear to auscultation bilaterally Heart: regular rate and rhythm, S1, S2 normal, no murmur, click, rub or gallop Abdomen: soft, non-tender; bowel sounds normal; no masses,  no organomegaly Pulses: 2+ and symmetric Skin: Skin color, texture, turgor normal. No rashes or lesions Lymph nodes:  Cervical, supraclavicular, and axillary nodes normal.  Assessment and Plan:  Diabetes mellitus type 2 in obese Well-controlled on Victoza alone , with intermittent use seeing no difference in blood sugars,  hemoglobin A1c has been consistently less than 7.0 .  She is due for annual  eye exams and her foot exam is normal. Using RYR due to history of prior statin intolerance..       Hypertension Well controlled on current regimen. Renal function due, no changes today.    Obesity (BMI 30-39.9) I have repeatedly  addressed  BMI and recommended wt loss of 10% of  body weight.  She follows a low glycemic index diet and walks regularly for exercise .  She is not interested in bariatric referral at this time.    Hyperlipidemia LDL goal < 70 LDL is 81 on Red yeast rice .  No changes today       Updated Medication List Outpatient Encounter Prescriptions as of 01/16/2014  Medication Sig  . glucose blood test strip Use as instructed  . Liraglutide (VICTOZA) 18 MG/3ML SOLN injection Inject 1.8 mg into the skin. Inject 1.8 mg subcutaneous daily  . Red Yeast Rice Extract 600 MG CAPS Take 600 mg by mouth 2 (two) times daily.    . [DISCONTINUED] Liraglutide (VICTOZA) 18 MG/3ML SOLN injection Inject 1.8 mg subcutaneous daily  . Liraglutide 18 MG/3ML SOPN Inject 1.8 mg into the skin daily.     Orders Placed This Encounter  Procedures  . HM DIABETES EYE EXAM  . HM DIABETES FOOT EXAM    No Follow-up on file.

## 2014-01-18 NOTE — Assessment & Plan Note (Signed)
LDL is 81 on Red yeast rice .  No changes today

## 2014-01-18 NOTE — Assessment & Plan Note (Signed)
I have repeatedly  addressed  BMI and recommended wt loss of 10% of body weight.  She follows a low glycemic index diet and walks regularly for exercise .  She is not interested in bariatric referral at this time.

## 2014-01-18 NOTE — Assessment & Plan Note (Signed)
Well-controlled on Victoza alone , with intermittent use seeing no difference in blood sugars,  hemoglobin A1c has been consistently less than 7.0 .  She is due for annual  eye exams and her foot exam is normal. Using RYR due to history of prior statin intolerance.Marland Kitchen

## 2014-01-18 NOTE — Assessment & Plan Note (Signed)
Well controlled on current regimen. Renal function due.,   no changes today. 

## 2014-01-29 ENCOUNTER — Telehealth: Payer: Self-pay

## 2014-01-29 NOTE — Telephone Encounter (Signed)
Relevant patient education assigned to patient using Emmi. ° °

## 2014-04-07 ENCOUNTER — Ambulatory Visit: Payer: Self-pay | Admitting: Internal Medicine

## 2014-04-07 LAB — HM MAMMOGRAPHY: HM MAMMO: NORMAL

## 2014-04-08 ENCOUNTER — Encounter: Payer: Self-pay | Admitting: *Deleted

## 2014-05-01 ENCOUNTER — Encounter: Payer: 59 | Admitting: Internal Medicine

## 2014-06-09 ENCOUNTER — Other Ambulatory Visit: Payer: Self-pay | Admitting: *Deleted

## 2014-06-09 ENCOUNTER — Encounter: Payer: Self-pay | Admitting: *Deleted

## 2014-06-09 DIAGNOSIS — E119 Type 2 diabetes mellitus without complications: Secondary | ICD-10-CM

## 2014-08-17 LAB — HEMOGLOBIN A1C: Hgb A1c MFr Bld: 6.4 % — AB (ref 4.0–6.0)

## 2014-09-08 ENCOUNTER — Other Ambulatory Visit: Payer: Self-pay | Admitting: Internal Medicine

## 2014-09-30 ENCOUNTER — Telehealth: Payer: Self-pay

## 2014-09-30 NOTE — Telephone Encounter (Signed)
The patient is hoping to have her lab order slip faxed to [phone] at [facility] for her cpe apt in Jan

## 2014-09-30 NOTE — Telephone Encounter (Signed)
Placed slip in red folder,

## 2014-10-01 NOTE — Telephone Encounter (Signed)
Faxed as requested

## 2014-10-01 NOTE — Telephone Encounter (Signed)
Letter printed.

## 2014-10-05 ENCOUNTER — Other Ambulatory Visit: Payer: Self-pay | Admitting: Internal Medicine

## 2014-10-07 ENCOUNTER — Encounter: Payer: Self-pay | Admitting: Internal Medicine

## 2014-10-08 ENCOUNTER — Other Ambulatory Visit: Payer: Self-pay | Admitting: Internal Medicine

## 2014-10-30 ENCOUNTER — Telehealth: Payer: Self-pay | Admitting: Internal Medicine

## 2014-10-30 ENCOUNTER — Ambulatory Visit: Payer: Self-pay | Admitting: Internal Medicine

## 2014-10-30 LAB — BASIC METABOLIC PANEL
BUN: 17 mg/dL (ref 4–21)
Creatinine: 0.9 mg/dL (ref 0.5–1.1)
Glucose: 123 mg/dL
Potassium: 4.4 mmol/L (ref 3.4–5.3)
SODIUM: 138 mmol/L (ref 137–147)

## 2014-10-30 LAB — COMPREHENSIVE METABOLIC PANEL
ALK PHOS: 62 U/L
ALT: 26 U/L
AST: 16 U/L (ref 15–37)
Albumin: 3.5 g/dL (ref 3.4–5.0)
Anion Gap: 6 — ABNORMAL LOW (ref 7–16)
BILIRUBIN TOTAL: 0.5 mg/dL (ref 0.2–1.0)
BUN: 17 mg/dL (ref 7–18)
CHLORIDE: 103 mmol/L (ref 98–107)
CO2: 29 mmol/L (ref 21–32)
Calcium, Total: 8.8 mg/dL (ref 8.5–10.1)
Creatinine: 0.89 mg/dL (ref 0.60–1.30)
EGFR (African American): >60
Glucose: 123 mg/dL — ABNORMAL HIGH (ref 65–99)
Osmolality: 279 (ref 275–301)
Potassium: 4.4 mmol/L (ref 3.5–5.1)
Sodium: 138 mmol/L (ref 136–145)
TOTAL PROTEIN: 7.5 g/dL (ref 6.4–8.2)

## 2014-10-30 LAB — HEPATIC FUNCTION PANEL
ALK PHOS: 62 U/L (ref 25–125)
ALT: 26 U/L (ref 7–35)
AST: 16 U/L (ref 13–35)
BILIRUBIN, TOTAL: 0.5 mg/dL

## 2014-10-30 LAB — LIPID PANEL
Cholesterol: 180 mg/dL (ref 0–200)
HDL: 48 mg/dL (ref 40–60)
LDL CHOLESTEROL, CALC: 108 mg/dL — AB (ref 0–100)
TRIGLYCERIDES: 118 mg/dL (ref 0–200)
VLDL Cholesterol, Calc: 24 mg/dL (ref 5–40)

## 2014-10-30 LAB — HEMOGLOBIN A1C: Hgb A1c MFr Bld: 6.3 % — AB (ref 4.0–6.0)

## 2014-10-30 NOTE — Telephone Encounter (Signed)
Labs from 10/30/14 showed normal kidney and liver function

## 2014-10-30 NOTE — Telephone Encounter (Signed)
Patient notified and voiced understanding.

## 2014-11-02 LAB — BASIC METABOLIC PANEL
Creatinine: 0.9 mg/dL (ref 0.5–1.1)
GLUCOSE: 123 mg/dL
POTASSIUM: 4.4 mmol/L (ref 3.4–5.3)
SODIUM: 138 mmol/L (ref 137–147)

## 2014-11-02 LAB — LIPID PANEL
Cholesterol: 180 mg/dL (ref 0–200)
HDL: 48 mg/dL (ref 35–70)
LDL Cholesterol: 108 mg/dL
Triglycerides: 118 mg/dL (ref 40–160)

## 2014-11-12 ENCOUNTER — Encounter: Payer: Self-pay | Admitting: Internal Medicine

## 2014-11-13 ENCOUNTER — Encounter: Payer: 59 | Admitting: Internal Medicine

## 2014-11-20 ENCOUNTER — Encounter: Payer: Self-pay | Admitting: Internal Medicine

## 2014-11-25 ENCOUNTER — Encounter: Payer: Self-pay | Admitting: Internal Medicine

## 2014-12-10 ENCOUNTER — Other Ambulatory Visit: Payer: Self-pay | Admitting: Internal Medicine

## 2014-12-18 ENCOUNTER — Encounter: Payer: Self-pay | Admitting: Internal Medicine

## 2014-12-18 ENCOUNTER — Ambulatory Visit (INDEPENDENT_AMBULATORY_CARE_PROVIDER_SITE_OTHER): Payer: 59 | Admitting: Internal Medicine

## 2014-12-18 VITALS — BP 118/72 | HR 66 | Temp 98.4°F | Resp 16 | Ht 61.0 in | Wt 214.0 lb

## 2014-12-18 DIAGNOSIS — R1011 Right upper quadrant pain: Secondary | ICD-10-CM

## 2014-12-18 DIAGNOSIS — Z90710 Acquired absence of both cervix and uterus: Secondary | ICD-10-CM | POA: Insufficient documentation

## 2014-12-18 DIAGNOSIS — Z90722 Acquired absence of ovaries, bilateral: Secondary | ICD-10-CM

## 2014-12-18 DIAGNOSIS — E1169 Type 2 diabetes mellitus with other specified complication: Secondary | ICD-10-CM

## 2014-12-18 DIAGNOSIS — Z0000 Encounter for general adult medical examination without abnormal findings: Secondary | ICD-10-CM

## 2014-12-18 DIAGNOSIS — E785 Hyperlipidemia, unspecified: Secondary | ICD-10-CM

## 2014-12-18 DIAGNOSIS — Z8619 Personal history of other infectious and parasitic diseases: Secondary | ICD-10-CM

## 2014-12-18 DIAGNOSIS — Z9079 Acquired absence of other genital organ(s): Secondary | ICD-10-CM

## 2014-12-18 DIAGNOSIS — I10 Essential (primary) hypertension: Secondary | ICD-10-CM

## 2014-12-18 DIAGNOSIS — E119 Type 2 diabetes mellitus without complications: Secondary | ICD-10-CM

## 2014-12-18 DIAGNOSIS — Z1211 Encounter for screening for malignant neoplasm of colon: Secondary | ICD-10-CM

## 2014-12-18 DIAGNOSIS — E1336 Other specified diabetes mellitus with diabetic cataract: Secondary | ICD-10-CM

## 2014-12-18 DIAGNOSIS — R740 Nonspecific elevation of levels of transaminase and lactic acid dehydrogenase [LDH]: Secondary | ICD-10-CM

## 2014-12-18 DIAGNOSIS — E669 Obesity, unspecified: Secondary | ICD-10-CM

## 2014-12-18 DIAGNOSIS — R7401 Elevation of levels of liver transaminase levels: Secondary | ICD-10-CM | POA: Insufficient documentation

## 2014-12-18 LAB — COMPREHENSIVE METABOLIC PANEL
ALK PHOS: 115 U/L (ref 39–117)
ALT: 275 U/L — ABNORMAL HIGH (ref 0–35)
AST: 49 U/L — ABNORMAL HIGH (ref 0–37)
Albumin: 4.2 g/dL (ref 3.5–5.2)
BILIRUBIN TOTAL: 0.7 mg/dL (ref 0.2–1.2)
BUN: 18 mg/dL (ref 6–23)
CALCIUM: 9.4 mg/dL (ref 8.4–10.5)
CO2: 30 mEq/L (ref 19–32)
Chloride: 102 mEq/L (ref 96–112)
Creatinine, Ser: 0.77 mg/dL (ref 0.40–1.20)
GFR: 80.88 mL/min (ref >60.00)
GLUCOSE: 124 mg/dL — AB (ref 70–99)
Potassium: 4.3 mEq/L (ref 3.5–5.1)
SODIUM: 138 meq/L (ref 135–145)
Total Protein: 7.1 g/dL (ref 6.0–8.3)

## 2014-12-18 LAB — GAMMA GT: GGT: 243 U/L — ABNORMAL HIGH (ref 7–51)

## 2014-12-18 LAB — LIPASE: Lipase: 35 U/L (ref 11.0–59.0)

## 2014-12-18 LAB — HM DIABETES FOOT EXAM: HM Diabetic Foot Exam: NORMAL

## 2014-12-18 NOTE — Progress Notes (Signed)
Pre-visit discussion using our clinic review tool. No additional management support is needed unless otherwise documented below in the visit note.  

## 2014-12-18 NOTE — Assessment & Plan Note (Addendum)
Likely secondary to victoza . She has a history of gangrenous cholecystitis in 2008 s/p lap chole, and biliary stent placemement followed by removal in 2008..  Jaundice and presumed hyperbilirubinemia has resolved so recurrent biliary obstruction is unlikely .   Will repeat LFTS next week next week.   Lab Results  Component Value Date   ALT 275* 12/18/2014   AST 49* 12/18/2014   ALKPHOS 115 12/18/2014   BILITOT 0.7 12/18/2014

## 2014-12-18 NOTE — Patient Instructions (Signed)
Do NOT resume the victoza.  Just follow your blood sugars for now and let Health Maintenance Adopting a healthy lifestyle and getting preventive care can go a long way to promote health and wellness. Talk with your health care provider about what schedule of regular examinations is right for you. This is a good chance for you to check in with your provider about disease prevention and staying healthy. In between checkups, there are plenty of things you can do on your own. Experts have done a lot of research about which lifestyle changes and preventive measures are most likely to keep you healthy. Ask your health care provider for more information. WEIGHT AND DIET  Eat a healthy diet  Be sure to include plenty of vegetables, fruits, low-fat dairy products, and lean protein.  Do not eat a lot of foods high in solid fats, added sugars, or salt.  Get regular exercise. This is one of the most important things you can do for your health.  Most adults should exercise for at least 150 minutes each week. The exercise should increase your heart rate and make you sweat (moderate-intensity exercise).  Most adults should also do strengthening exercises at least twice a week. This is in addition to the moderate-intensity exercise.  Maintain a healthy weight  Body mass index (BMI) is a measurement that can be used to identify possible weight problems. It estimates body fat based on height and weight. Your health care provider can help determine your BMI and help you achieve or maintain a healthy weight.  For females 13 years of age and older:   A BMI below 18.5 is considered underweight.  A BMI of 18.5 to 24.9 is normal.  A BMI of 25 to 29.9 is considered overweight.  A BMI of 30 and above is considered obese.  Watch levels of cholesterol and blood lipids  You should start having your blood tested for lipids and cholesterol at 62 years of age, then have this test every 5 years.  You may need to  have your cholesterol levels checked more often if:  Your lipid or cholesterol levels are high.  You are older than 62 years of age.  You are at high risk for heart disease.  CANCER SCREENING   Lung Cancer  Lung cancer screening is recommended for adults 33-40 years old who are at high risk for lung cancer because of a history of smoking.  A yearly low-dose CT scan of the lungs is recommended for people who:  Currently smoke.  Have quit within the past 15 years.  Have at least a 30-pack-year history of smoking. A pack year is smoking an average of one pack of cigarettes a day for 1 year.  Yearly screening should continue until it has been 15 years since you quit.  Yearly screening should stop if you develop a health problem that would prevent you from having lung cancer treatment.  Breast Cancer  Practice breast self-awareness. This means understanding how your breasts normally appear and feel.  It also means doing regular breast self-exams. Let your health care provider know about any changes, no matter how small.  If you are in your 20s or 30s, you should have a clinical breast exam (CBE) by a health care provider every 1-3 years as part of a regular health exam.  If you are 69 or older, have a CBE every year. Also consider having a breast X-Si (mammogram) every year.  If you have a family history of  breast cancer, talk to your health care provider about genetic screening.  If you are at high risk for breast cancer, talk to your health care provider about having an MRI and a mammogram every year.  Breast cancer gene (BRCA) assessment is recommended for women who have family members with BRCA-related cancers. BRCA-related cancers include:  Breast.  Ovarian.  Tubal.  Peritoneal cancers.  Results of the assessment will determine the need for genetic counseling and BRCA1 and BRCA2 testing. Cervical Cancer Routine pelvic examinations to screen for cervical cancer  are no longer recommended for nonpregnant women who are considered low risk for cancer of the pelvic organs (ovaries, uterus, and vagina) and who do not have symptoms. A pelvic examination may be necessary if you have symptoms including those associated with pelvic infections. Ask your health care provider if a screening pelvic exam is right for you.   The Pap test is the screening test for cervical cancer for women who are considered at risk.  If you had a hysterectomy for a problem that was not cancer or a condition that could lead to cancer, then you no longer need Pap tests.  If you are older than 65 years, and you have had normal Pap tests for the past 10 years, you no longer need to have Pap tests.  If you have had past treatment for cervical cancer or a condition that could lead to cancer, you need Pap tests and screening for cancer for at least 20 years after your treatment.  If you no longer get a Pap test, assess your risk factors if they change (such as having a new sexual partner). This can affect whether you should start being screened again.  Some women have medical problems that increase their chance of getting cervical cancer. If this is the case for you, your health care provider may recommend more frequent screening and Pap tests.  The human papillomavirus (HPV) test is another test that may be used for cervical cancer screening. The HPV test looks for the virus that can cause cell changes in the cervix. The cells collected during the Pap test can be tested for HPV.  The HPV test can be used to screen women 54 years of age and older. Getting tested for HPV can extend the interval between normal Pap tests from three to five years.  An HPV test also should be used to screen women of any age who have unclear Pap test results.  After 62 years of age, women should have HPV testing as often as Pap tests.  Colorectal Cancer  This type of cancer can be detected and often  prevented.  Routine colorectal cancer screening usually begins at 62 years of age and continues through 62 years of age.  Your health care provider may recommend screening at an earlier age if you have risk factors for colon cancer.  Your health care provider may also recommend using home test kits to check for hidden blood in the stool.  A small camera at the end of a tube can be used to examine your colon directly (sigmoidoscopy or colonoscopy). This is done to check for the earliest forms of colorectal cancer.  Routine screening usually begins at age 36.  Direct examination of the colon should be repeated every 5-10 years through 62 years of age. However, you may need to be screened more often if early forms of precancerous polyps or small growths are found. Skin Cancer  Check your skin from head to  toe regularly.  Tell your health care provider about any new moles or changes in moles, especially if there is a change in a mole's shape or color.  Also tell your health care provider if you have a mole that is larger than the size of a pencil eraser.  Always use sunscreen. Apply sunscreen liberally and repeatedly throughout the day.  Protect yourself by wearing long sleeves, pants, a wide-brimmed hat, and sunglasses whenever you are outside. HEART DISEASE, DIABETES, AND HIGH BLOOD PRESSURE   Have your blood pressure checked at least every 1-2 years. High blood pressure causes heart disease and increases the risk of stroke.  If you are between 39 years and 24 years old, ask your health care provider if you should take aspirin to prevent strokes.  Have regular diabetes screenings. This involves taking a blood sample to check your fasting blood sugar level.  If you are at a normal weight and have a low risk for diabetes, have this test once every three years after 62 years of age.  If you are overweight and have a high risk for diabetes, consider being tested at a younger age or more  often. PREVENTING INFECTION  Hepatitis B  If you have a higher risk for hepatitis B, you should be screened for this virus. You are considered at high risk for hepatitis B if:  You were born in a country where hepatitis B is common. Ask your health care provider which countries are considered high risk.  Your parents were born in a high-risk country, and you have not been immunized against hepatitis B (hepatitis B vaccine).  You have HIV or AIDS.  You use needles to inject street drugs.  You live with someone who has hepatitis B.  You have had sex with someone who has hepatitis B.  You get hemodialysis treatment.  You take certain medicines for conditions, including cancer, organ transplantation, and autoimmune conditions. Hepatitis C  Blood testing is recommended for:  Everyone born from [birth year] through 1963.  Anyone with known risk factors for hepatitis C. Sexually transmitted infections (STIs)  You should be screened for sexually transmitted infections (STIs) including gonorrhea and chlamydia if:  You are sexually active and are younger than 62 years of age.  You are older than 62 years of age and your health care provider tells you that you are at risk for this type of infection.  Your sexual activity has changed since you were last screened and you are at an increased risk for chlamydia or gonorrhea. Ask your health care provider if you are at risk.  If you do not have HIV, but are at risk, it may be recommended that you take a prescription medicine daily to prevent HIV infection. This is called pre-exposure prophylaxis (PrEP). You are considered at risk if:  You are sexually active and do not regularly use condoms or know the HIV status of your partner(s).  You take drugs by injection.  You are sexually active with a partner who has HIV. Talk with your health care provider about whether you are at high risk of being infected with HIV. If you choose to begin PrEP, you  should first be tested for HIV. You should then be tested every 3 months for as long as you are taking PrEP.  PREGNANCY   If you are premenopausal and you may become pregnant, ask your health care provider about preconception counseling.  If you may become pregnant, take 400 to 800 micrograms (mcg)  of folic acid every day.  If you want to prevent pregnancy, talk to your health care provider about birth control (contraception). OSTEOPOROSIS AND MENOPAUSE   Osteoporosis is a disease in which the bones lose minerals and strength with aging. This can result in serious bone fractures. Your risk for osteoporosis can be identified using a bone density scan.  If you are 50 years of age or older, or if you are at risk for osteoporosis and fractures, ask your health care provider if you should be screened.  Ask your health care provider whether you should take a calcium or vitamin D supplement to lower your risk for osteoporosis.  Menopause may have certain physical symptoms and risks.  Hormone replacement therapy may reduce some of these symptoms and risks. Talk to your health care provider about whether hormone replacement therapy is right for you.  HOME CARE INSTRUCTIONS   Schedule regular health, dental, and eye exams.  Stay current with your immunizations.   Do not use any tobacco products including cigarettes, chewing tobacco, or electronic cigarettes.  If you are pregnant, do not drink alcohol.  If you are breastfeeding, limit how much and how often you drink alcohol.  Limit alcohol intake to no more than 1 drink per day for nonpregnant women. One drink equals 12 ounces of beer, 5 ounces of wine, or 1 ounces of hard liquor.  Do not use street drugs.  Do not share needles.  Ask your health care provider for help if you need support or information about quitting drugs.  Tell your health care provider if you often feel depressed.  Tell your health care provider if you have ever  been abused or do not feel safe at home. Document Released: 04/24/2011 Document Revised: 02/23/2014 Document Reviewed: 09/10/2013 [facility] Patient Information 2015 [place], [place]. This information is not intended to replace advice given to you by your health care provider. Make sure you discuss any questions you have with your health care provider. me know if they increase to > 150

## 2014-12-18 NOTE — Progress Notes (Signed)
Patient ID: Kimberly Brady, female   DOB: February 13, 1951, 64 y.o.   MRN: [id number]   Subjective:     Kimberly Brady is a 64 y.o. female and is here for a comprehensive physical exam. The patient reports recent epiosde of nausea and pruritus.  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   OTW thinks she had  "GI virus."  Reports that on Sunday night developed nausea . Monday am had a poor appetite,  Noticed that her Urine was dark yellow , then had a clay white stool,  Followed by the development of generalized pruritus, which lasted for a day or so.  Felt that her sclera were slightly yellow as well.   Symptoms resolved 48 hours  Ago.  Stopped her victoza at onset of symptoms ,  Last dose was 5 days ago. Husband had no similar symptoms.   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 Marital Status: Married    Spouse Name: N/A  . Number of Children: N/A  . Years of Education: N/A   Occupational History  . Not on file.   Social History Main Topics  . Smoking status: Never Smoker   . Smokeless tobacco: Never Used  . Alcohol Use: No  . Drug Use: No  . Sexual Activity: Not on file   Other Topics Concern  . Not on file   Social History Narrative   THERE ARE LAB RESULTS IN THE PAPER CHART BUT NOT UPDATED IN COMPUTER SYSTEM AS I WAS UNABLE TO READ THE RESULTS ACCURATELY...            Health Maintenance  Topic Date Due  . PNEUMOCOCCAL POLYSACCHARIDE VACCINE (1) 07/08/1955  . HIV Screening  07/07/1968  . PAP SMEAR  07/08/1971  . ZOSTAVAX  07/07/2013  . INFLUENZA VACCINE  05/23/2014  . OPHTHALMOLOGY EXAM  10/20/2014  . FOOT EXAM  01/17/2015  . HEMOGLOBIN A1C  04/30/2015  . URINE MICROALBUMIN  10/31/2015  . MAMMOGRAM  04/07/2016  . COLONOSCOPY   10/23/2016  . TETANUS/TDAP  01/22/2023    The following portions of the patient's history were reviewed and updated as appropriate: allergies, current medications, past family history, past medical history, past social history, past surgical history and problem list.  Review of Systems A comprehensive review of systems was negative.   Objective:   BP 118/72 mmHg  Pulse 66  Temp(Src) 98.4 F (36.9 C) (Oral)  Resp 16  Ht 5' 1"  (1.549 m)  Wt 214 lb (97.07 kg)  BMI 40.46 kg/m2  SpO2 96%  General appearance: alert, cooperative and appears stated age Head: Normocephalic, without obvious abnormality, atraumatic Eyes: conjunctivae/corneas clear.  No incterus. PERRL, EOM's intact. Fundi benign. Ears: normal TM's and external ear canals both ears Nose: Nares normal. Septum midline. Mucosa normal. No drainage or sinus tenderness. Throat: lips, mucosa, and tongue normal; teeth and gums normal Neck: no adenopathy, no carotid bruit, no JVD, supple, symmetrical, trachea midline and thyroid not enlarged, symmetric, no tenderness/mass/nodules Lungs: clear to auscultation bilaterally Breasts: normal appearance, no masses or tenderness Heart: regular rate and rhythm, S1, S2 normal, no murmur, click, rub or gallop Abdomen: soft, non-tender; bowel sounds normal; no masses,  no organomegaly Extremities: extremities normal, atraumatic, no cyanosis or edema  Pulses: 2+ and symmetric Skin: Skin color, texture, turgor normal. No jaundice, rashes or lesions Neurologic: Alert and oriented X 3, normal strength and tone. Normal symmetric reflexes. Normal coordination and gait.    Assessment and Plan:    Problem List Items Addressed This Visit    Visit for preventive health examination    Annual wellness  exam was done as well as a comprehensive physical exam and management of acute and chronic conditions .  During the course of the visit the patient was educated and counseled about appropriate screening  and preventive services including :  diabetes screening, lipid analysis with projected  10 year  risk for CAD , nutrition counseling, colorectal cancer screening, and recommended immunizations.  Printed recommendations for health maintenance screenings was given.       Transaminitis    Likely secondary to victoza . She has a history of gangrenous cholecystitis in 2008 s/p lap chole, and biliary stent placemement followed by removal in 2008..  Jaundice and presumed hyperbilirubinemia has resolved so recurrent biliary obstruction is unlikely .   Will repeat LFTS next week next week.   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  Body mass index is 40.46 kg/(m^2). She is already following a  low glycemic index diet and utilizing smaller more frequent meals to increase metabolism.  I have  recommended that patient start exercising with a goal of 30 minutes of aerobic exercise a minimum of 5 days per week.   Wt Readings from Last 3 Encounters:  12/18/14 214 lb (97.07 kg)  01/16/14 218 lb 12 oz (99.224 kg)  09/12/13 217 lb 4 oz (98.544 kg)         Hypertension    Well controlled on current regimen. Renal function stable, no changes today. Microal/cr ratio WAS NORMAL AT [facility] , [place] 2016       Hyperlipidemia with target LDL less than 70    LDL is 108  on Red yeast rice .  No changes today.   She has statin intolerance.  Continue daily asa        History of Lyme disease   Diabetes mellitus type 2 in obese    Well-controlled on Victoza alone , but given recent episode of jaundice and elevated liver enzymes on today's lab I am recommeding permanent discontinuation of  medication.. Urine Microalb/cr ratio was done at [facility] and was normal January 2016.  Marland Kitchen  She is due for annual  eye exams and her foot exam is normal. Using RYR due to history of prior statin intolerance..  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        Other Visit Diagnoses    Right upper quadrant pain  Relevant Orders    Comp Met (CMET) (Completed)    Gamma GT (Completed)    Lipase (Completed)

## 2014-12-20 ENCOUNTER — Encounter: Payer: Self-pay | Admitting: Internal Medicine

## 2014-12-20 DIAGNOSIS — Z8619 Personal history of other infectious and parasitic diseases: Secondary | ICD-10-CM | POA: Insufficient documentation

## 2014-12-20 DIAGNOSIS — Z0000 Encounter for general adult medical examination without abnormal findings: Secondary | ICD-10-CM | POA: Insufficient documentation

## 2014-12-20 NOTE — Assessment & Plan Note (Signed)
Well controlled on current regimen. Renal function stable, no changes today. Microal/cr ratio WAS NORMAL AT [facility] , [place]AN 2016

## 2014-12-20 NOTE — Assessment & Plan Note (Signed)
LDL is 108  on Red yeast rice .  No changes today.   She has statin intolerance.  Continue daily asa

## 2014-12-20 NOTE — Assessment & Plan Note (Signed)
I have addressed  BMI again today.  Body mass index is 40.46 kg/(m^2). She is already following a  low glycemic index diet and utilizing smaller more frequent meals to increase metabolism.  I have  recommended that patient start exercising with a goal of 30 minutes of aerobic exercise a minimum of 5 days per week.   Wt Readings from Last 3 Encounters:  12/18/14 214 lb (97.07 kg)  01/16/14 218 lb 12 oz (99.224 kg)  09/12/13 217 lb 4 oz (98.544 kg)

## 2014-12-20 NOTE — Assessment & Plan Note (Signed)

## 2014-12-20 NOTE — Assessment & Plan Note (Signed)
Continue annual follow up with Porfilia s/p bilateral extraction.

## 2014-12-20 NOTE — Assessment & Plan Note (Signed)
Well-controlled on Victoza alone , but given recent episode of jaundice and elevated liver enzymes on today's lab I am recommeding permanent discontinuation of medication.. Urine Microalb/cr ratio was done at [facility] and was normal January 2016.  Marland Kitchen  She is due for annual  eye exams and her foot exam is normal. Using RYR due to history of prior statin intolerance..  Lab Results  Component Value Date   HGBA1C 6.3* 10/30/2014   Lab Results  Component Value Date   CREATININE 0.77 12/18/2014

## 2014-12-22 ENCOUNTER — Telehealth: Payer: Self-pay | Admitting: Internal Medicine

## 2014-12-22 ENCOUNTER — Ambulatory Visit: Payer: Self-pay | Admitting: Internal Medicine

## 2014-12-22 LAB — HEPATIC FUNCTION PANEL
ALT: 100 U/L — AB (ref 7–35)
ALT: 100 U/L — AB (ref 7–35)
AST: 21 U/L (ref 13–35)
Alkaline Phosphatase: 88 U/L (ref 25–125)
Bilirubin, Direct: 0.2 mg/dL (ref 0.01–0.4)
Bilirubin, Total: 0.4 mg/dL

## 2014-12-22 NOTE — Telephone Encounter (Signed)
emmi emailed °

## 2014-12-24 ENCOUNTER — Telehealth: Payer: Self-pay | Admitting: Internal Medicine

## 2014-12-24 NOTE — Telephone Encounter (Signed)
Her liver tests are improving.  Does not need imaging if feeling fine

## 2014-12-24 NOTE — Telephone Encounter (Signed)
Patient notified and voiced she was feeling great,

## 2015-01-14 ENCOUNTER — Other Ambulatory Visit: Payer: Self-pay | Admitting: Internal Medicine

## 2015-02-14 NOTE — Op Note (Signed)
PATIENT NAME:  Kimberly Brady, Kimberly Brady MR#:  [id number] DATE OF BIRTH:  03-20-1951  DATE OF PROCEDURE:  10/25/2011  PREOPERATIVE DIAGNOSIS: Visually significant cataract of the left eye.   POSTOPERATIVE DIAGNOSIS: Visually significant cataract of the left eye.   OPERATIVE PROCEDURE: Cataract extraction by phacoemulsification with implant of intraocular lens to the left eye.   SURGEON: Birder Robson, MD  ANESTHESIA:  1. Managed anesthesia care.  2. 50-50 mixture of 0.75% bupivacaine and 4% Xylocaine given as a retrobulbar block.   COMPLICATIONS: None.   TECHNIQUE:  Four quadrant divide-and-conquer    DESCRIPTION OF PROCEDURE: The patient was examined and consented for this procedure in the preoperative holding area and then brought back to the Operating Room where the anesthesia team employed managed anesthesia care.  3.5 milliliters of the aforementioned mixture were placed in the left orbit on an Atkinson needle without complication. The left eye was then prepped and draped in the usual sterile ophthalmic fashion. A lid speculum was placed. The side-port blade was used to create a paracentesis and the anterior chamber was filled with viscoelastic. The keratome was used to create a near clear corneal incision. The continuous curvilinear capsulorrhexis was performed with a cystotome followed by the capsulorrhexis forceps. Hydrodissection and hydrodelineation were carried out with BSS on a blunt cannula. The lens was removed in a four quadrant divide-and-conquer technique. The remaining cortical material was removed with the irrigation-aspiration handpiece. The capsular bag was inflated with viscoelastic and the Technus ZCB00 13.0-diopter lens, serial number [id number] was placed in the capsular bag without complication. The remaining viscoelastic was removed from the eye with the irrigation-aspiration handpiece. The wounds were hydrated. The anterior chamber was flushed with Miostat. The eye was inflated  to a physiologic pressure and the wounds were found to be water tight.  The eye was dressed with Vigamox followed by Maxitrol ointment and a protective shield was placed. The patient will follow-up with me in one day.   ____________________________ Livingston Diones. Annslee Tercero, MD wlp:drc D: 10/25/2011 17:16:43 ET T: 10/25/2011 18:24:57 ET JOB#: [id number]  cc: Onica Davidovich L. Afrika Brick, MD, <Dictator> Livingston Diones Garek Schuneman MD ELECTRONICALLY SIGNED 10/31/2011 13:53

## 2015-03-19 ENCOUNTER — Ambulatory Visit: Payer: 59 | Admitting: Internal Medicine

## 2015-04-05 ENCOUNTER — Telehealth: Payer: Self-pay

## 2015-04-09 ENCOUNTER — Ambulatory Visit (INDEPENDENT_AMBULATORY_CARE_PROVIDER_SITE_OTHER): Payer: 59 | Admitting: Internal Medicine

## 2015-04-09 ENCOUNTER — Encounter: Payer: Self-pay | Admitting: Internal Medicine

## 2015-04-09 VITALS — BP 126/76 | HR 72 | Temp 98.6°F | Resp 14 | Ht 61.0 in | Wt 216.5 lb

## 2015-04-09 DIAGNOSIS — E119 Type 2 diabetes mellitus without complications: Secondary | ICD-10-CM | POA: Diagnosis not present

## 2015-04-09 DIAGNOSIS — E669 Obesity, unspecified: Secondary | ICD-10-CM

## 2015-04-09 DIAGNOSIS — E1169 Type 2 diabetes mellitus with other specified complication: Secondary | ICD-10-CM

## 2015-04-09 DIAGNOSIS — M21612 Bunion of left foot: Secondary | ICD-10-CM

## 2015-04-09 DIAGNOSIS — Z1211 Encounter for screening for malignant neoplasm of colon: Secondary | ICD-10-CM | POA: Diagnosis not present

## 2015-04-09 DIAGNOSIS — M2012 Hallux valgus (acquired), left foot: Secondary | ICD-10-CM | POA: Diagnosis not present

## 2015-04-09 LAB — MICROALBUMIN / CREATININE URINE RATIO
CREATININE, U: 110.9 mg/dL
MICROALB/CREAT RATIO: 0.6 mg/g (ref 0.0–30.0)

## 2015-04-09 LAB — LIPID PANEL
CHOL/HDL RATIO: 4
Cholesterol: 180 mg/dL (ref 0–200)
HDL: 43.9 mg/dL (ref >39.00)
LDL Cholesterol: 97 mg/dL (ref 0–99)
NonHDL: 136.1
Triglycerides: 198 mg/dL — ABNORMAL HIGH (ref 0.0–149.0)
VLDL: 39.6 mg/dL (ref 0.0–40.0)

## 2015-04-09 LAB — COMPREHENSIVE METABOLIC PANEL
ALBUMIN: 4.2 g/dL (ref 3.5–5.2)
ALT: 19 U/L (ref 0–35)
AST: 17 U/L (ref 0–37)
Alkaline Phosphatase: 59 U/L (ref 39–117)
BUN: 19 mg/dL (ref 6–23)
CALCIUM: 9.5 mg/dL (ref 8.4–10.5)
CO2: 26 meq/L (ref 19–32)
Chloride: 102 mEq/L (ref 96–112)
Creatinine, Ser: 0.76 mg/dL (ref 0.40–1.20)
GFR: 82.03 mL/min (ref >60.00)
GLUCOSE: 122 mg/dL — AB (ref 70–99)
POTASSIUM: 4.3 meq/L (ref 3.5–5.1)
SODIUM: 134 meq/L — AB (ref 135–145)
Total Bilirubin: 0.6 mg/dL (ref 0.2–1.2)
Total Protein: 7.2 g/dL (ref 6.0–8.3)

## 2015-04-09 NOTE — Progress Notes (Signed)
Subjective:  Patient ID: Kimberly Brady, female    DOB: 03/05/1951  Age: 64 y.o. MRN: [id number]  CC: The primary encounter diagnosis was Diabetes mellitus type 2 in obese. Diagnoses of Bunion of great toe of left foot, Diabetes mellitus without complication, Colon cancer screening, and Obesity (BMI 30-39.9) were also pertinent to this visit.  HPI Kimberly Brady presents for  follow up. On DM,  Hypertension and obesity .  She has gained weight,  2 lbs up from last visit .  She feels better without Vitctoza and her A1c was 6.4 without the medication by recent labs. Fasting sugars  < 130.  pre dinner sugars are 89 to 95.   Walking mostly for exercise,  Averaging three days per week. Appetite not large.     Outpatient Prescriptions Prior to Visit  Medication Sig Dispense Refill  . Red Yeast Rice Extract 600 MG CAPS Take 600 mg by mouth 2 (two) times daily.      . TRUEPLUS LANCETS 30G MISC TEST ONCE DAILY 100 each 1  . TRUETEST TEST test strip TEST ONCE DAILY 100 each 1   No facility-administered medications prior to visit.    Review of Systems;  Patient denies headache, fevers, malaise, unintentional weight loss, skin rash, eye pain, sinus congestion and sinus pain, sore throat, dysphagia,  hemoptysis , cough, dyspnea, wheezing, chest pain, palpitations, orthopnea, edema, abdominal pain, nausea, melena, diarrhea, constipation, flank pain, dysuria, hematuria, urinary  Frequency, nocturia, numbness, tingling, seizures,  Focal weakness, Loss of consciousness,  Tremor, insomnia, depression, anxiety, and suicidal ideation.      Objective:  BP 126/76 mmHg  Pulse 72  Temp(Src) 98.6 F (37 C) (Oral)  Resp 14  Ht 5\' 1"  (1.549 m)  Wt 216 lb 8 oz (98.204 kg)  BMI 40.93 kg/m2  SpO2 98%  BP Readings from Last 3 Encounters:  04/09/15 126/76  12/18/14 118/72  01/16/14 118/70    Wt Readings from Last 3 Encounters:  04/09/15 216 lb 8 oz (98.204 kg)  12/18/14 214 lb (97.07  kg)  01/16/14 218 lb 12 oz (99.224 kg)    General appearance: alert, cooperative and appears stated age Ears: normal TM's and external ear canals both ears Throat: lips, mucosa, and tongue normal; teeth and gums normal Neck: no adenopathy, no carotid bruit, supple, symmetrical, trachea midline and thyroid not enlarged, symmetric, no tenderness/mass/nodules Back: symmetric, no curvature. ROM normal. No CVA tenderness. Lungs: clear to auscultation bilaterally Heart: regular rate and rhythm, S1, S2 normal, no murmur, click, rub or gallop Abdomen: soft, non-tender; bowel sounds normal; no masses,  no organomegaly Pulses: 2+ and symmetric Skin: Skin color, texture, turgor normal. No rashes or lesions Lymph nodes: Cervical, supraclavicular, and axillary nodes normal.  Lab Results  Component Value Date   HGBA1C 6.3* 10/30/2014   HGBA1C 6.4* 08/17/2014   HGBA1C 6.4* 11/03/2013    Lab Results  Component Value Date   CREATININE 0.76 04/09/2015   CREATININE 0.77 12/18/2014   CREATININE 0.9 11/02/2014    Lab Results  Component Value Date   WBC 2.9* 05/10/2012   HGB 14.8 05/10/2012   HCT 42.8 05/10/2012   PLT 133* 05/10/2012   GLUCOSE 122* 04/09/2015   CHOL 180 04/09/2015   TRIG 198.0* 04/09/2015   HDL 43.90 04/09/2015   LDLCALC 97 04/09/2015   ALT 19 04/09/2015   AST 17 04/09/2015   NA 134* 04/09/2015   K 4.3 04/09/2015   CL 102 04/09/2015   CREATININE 0.76  04/09/2015   BUN 19 04/09/2015   CO2 26 04/09/2015   HGBA1C 6.3* 10/30/2014   MICROALBUR <0.7 04/09/2015    No results found.  Assessment & Plan:   Problem List Items Addressed This Visit    Diabetes mellitus type 2 in obese - Primary    Well-controlled without Victoza  Urine Microalb/cr ratio was normal J. .  She is due for annual  eye exams and her foot exam is normal. Using RYR due to history of prior statin intolerance..  Lab Results  Component Value Date   HGBA1C 6.3* 10/30/2014   Lab Results  Component  Value Date   MICROALBUR <0.7 04/09/2015    Lab Results  Component Value Date   CREATININE 0.76 04/09/2015               Relevant Orders   Ambulatory referral to Ophthalmology   Obesity (BMI 30-39.9)    I have addressed  BMI again today.  Body mass index is 40.93 kg/(m^2). She is already following a  low glycemic index diet and utilizing smaller more frequent meals to increase metabolism.  I have  recommended that patient start exercising with a goal of 30 minutes of aerobic exercise a minimum of 5 days per week.   Wt Readings from Last 3 Encounters:  04/09/15 216 lb 8 oz (98.204 kg)  12/18/14 214 lb (97.07 kg)  01/16/14 218 lb 12 oz (99.224 kg)           Bunion of great toe of left foot    Does not want to see podiatyr yet.       Colon cancer screening   Relevant Orders   Ambulatory referral to Gastroenterology    Other Visit Diagnoses    Diabetes mellitus without complication        Relevant Orders    Comprehensive metabolic panel (Completed)    Lipid panel (Completed)    Microalbumin / creatinine urine ratio (Completed)      A total of 25 minutes of face to face time was spent with patient more than half of which was spent in counselling about the above mentioned conditions  and coordination of care   I am having [name] maintain her Red Yeast Rice Extract, TRUEPLUS LANCETS 30G, and TRUETEST TEST.  No orders of the defined types were placed in this encounter.    There are no discontinued medications.  Follow-up: Return in about 6 months (around 10/09/2015) for follow up diabetes.   Crecencio Mc, MD

## 2015-04-09 NOTE — Progress Notes (Signed)
Pre-visit discussion using our clinic review tool. No additional management support is needed unless otherwise documented below in the visit note.  

## 2015-04-09 NOTE — Assessment & Plan Note (Signed)
Does not want to see podiatyr yet.

## 2015-04-09 NOTE — Patient Instructions (Signed)
PLEASE GET YOUR EYES EXAMINED ASAP  We have made the appt for you   We will cal dr Allen Norris for [facility] colonoscopy appt  All appts to be done on fridays

## 2015-04-11 ENCOUNTER — Encounter: Payer: Self-pay | Admitting: Internal Medicine

## 2015-04-11 NOTE — Assessment & Plan Note (Signed)
Well-controlled without Victoza  Urine Microalb/cr ratio was normal J. .  She is due for annual  eye exams and her foot exam is normal. Using RYR due to history of prior statin intolerance..  Lab Results  Component Value Date   HGBA1C 6.3* 10/30/2014   Lab Results  Component Value Date   MICROALBUR <0.7 04/09/2015    Lab Results  Component Value Date   CREATININE 0.76 04/09/2015

## 2015-04-11 NOTE — Assessment & Plan Note (Signed)
I have addressed  BMI again today.  Body mass index is 40.93 kg/(m^2). She is already following a  low glycemic index diet and utilizing smaller more frequent meals to increase metabolism.  I have  recommended that patient start exercising with a goal of 30 minutes of aerobic exercise a minimum of 5 days per week.   Wt Readings from Last 3 Encounters:  04/09/15 216 lb 8 oz (98.204 kg)  12/18/14 214 lb (97.07 kg)  01/16/14 218 lb 12 oz (99.224 kg)

## 2015-04-19 ENCOUNTER — Other Ambulatory Visit: Payer: Self-pay

## 2015-04-19 ENCOUNTER — Telehealth: Payer: Self-pay

## 2015-04-19 NOTE — Telephone Encounter (Signed)
Gastroenterology Pre-Procedure Review  Request Date: 06-25-15 Requesting Physician: Dr. Derrel Nip  PATIENT REVIEW QUESTIONS: The patient responded to the following health history questions as indicated:    1. Are you having any GI issues? no 2. Do you have a personal history of Polyps? yes (repeat 3 years) 3. Do you have a family history of Colon Cancer or Polyps? yes (Mother colon cancer) 4. Diabetes Mellitus? yes (Previous history of medication) 5. Joint replacements in the past 12 months?no 6. Major health problems in the past 3 months?no 7. Any artificial heart valves, MVP, or defibrillator?no    MEDICATIONS & ALLERGIES:    Patient reports the following regarding taking any anticoagulation/antiplatelet therapy:   Plavix, Coumadin, Eliquis, Xarelto, Lovenox, Pradaxa, Brilinta, or Effient? no Aspirin? no  Patient confirms/reports the following medications:  Current Outpatient Prescriptions  Medication Sig Dispense Refill  . TRUEPLUS LANCETS 30G MISC TEST ONCE DAILY 100 each 1  . TRUETEST TEST test strip TEST ONCE DAILY 100 each 1  . Red Yeast Rice Extract 600 MG CAPS Take 600 mg by mouth 2 (two) times daily.       No current facility-administered medications for this visit.    Patient confirms/reports the following allergies:  No Known Allergies  No orders of the defined types were placed in this encounter.    AUTHORIZATION INFORMATION Primary Insurance: 1D#: Group #:  Secondary Insurance: 1D#: Group #:  SCHEDULE INFORMATION: Date: 06-25-15 Time: Location: [place]

## 2015-05-07 LAB — HM DIABETES EYE EXAM

## 2015-05-10 ENCOUNTER — Encounter: Payer: Self-pay | Admitting: Internal Medicine

## 2015-05-19 ENCOUNTER — Ambulatory Visit: Payer: 59 | Admitting: Pharmacist

## 2015-06-11 ENCOUNTER — Encounter: Payer: Self-pay | Admitting: Pharmacist

## 2015-06-11 ENCOUNTER — Other Ambulatory Visit: Payer: Self-pay | Admitting: Pharmacist

## 2015-06-12 NOTE — Patient Outreach (Signed)
[facility]) Care Management  [place]   06/12/2015  Kimberly Brady Nov 02, 1950 [id number]  Subjective: Kimberly Brady is a 64 year old female who is participating in the Employee Link to [employer].  She presents for her 6 month follow up.  She is currently eating a low carbohydrate/high protein diet.  She is not currently participating in organized exercise.  She is not currently prescribed any diabetes medications.  She was previously prescribed Victoza, but Victoza was discontinued due to elevated liver enzymes.  Her most recent A1c was  6.4% after stopping Victoza.  She monitors her blood sugar 2 to 3 times per week, and her blood sugar average over the last 30 days was 125 mg/dL.    Objective:  Blood pressure: 145/82  Current Medications: Current Outpatient Prescriptions  Medication Sig Dispense Refill  . TRUEPLUS LANCETS 30G MISC TEST ONCE DAILY 100 each 1  . TRUETEST TEST test strip TEST ONCE DAILY 100 each 1  . Red Yeast Rice Extract 600 MG CAPS Take 600 mg by mouth 2 (two) times daily.       No current facility-administered medications for this visit.    Functional Status: In your present state of health, do you have any difficulty performing the following activities: 06/11/2015  Hearing? N  Vision? N  Difficulty concentrating or making decisions? N  Walking or climbing stairs? N  Dressing or bathing? N  Doing errands, shopping? N  Preparing Food and eating ? N  Using the Toilet? N  In the past six months, have you accidently leaked urine? N  Do you have problems with loss of bowel control? N  Managing your Medications? N  Managing your Finances? N  Housekeeping or managing your Housekeeping? N    Fall/Depression Screening: PHQ 2/9 Scores 06/11/2015  PHQ - 2 Score 0    Assessment: 1. Diabetes: A1c is at goal of less than 7%.  Currently controlled by diet.  Patient may benefit from the initiation of metformin in the future.    2.  Blood pressure: Not at goal of less than 140/90 at visit today.  Patient reports checking blood pressure at work and BP is normally less than 140/90.  Patient is not currently on any antihypertensive medications.   Plan: 1.  Kimberly Brady will increase exercise to 15 minutes three days a week by walking dog in the neighborhood 2.  She will continue to eat low carb/high protein diet.  3.  She will follow up with Pharmacy in three months on November 18th, 2016.   [facility] CM Care Plan Problem One        Patient Outreach from 06/11/2015 in [place] Problem One  Exercise   Care Plan for Problem One  Active   [facility] Long Term Goal (31-90 days)  Patient will increase exercise to 15 minutes three days a week in the next 90 days per patient report    [place] Term Goal Start Date  06/11/15   Interventions for Problem One Long Term Goal  Educated on the importance of exercise for diabetes control    [facility] CM Care Plan Problem Two        Patient Outreach from 06/11/2015 in [place] Problem [place] for Problem Two  Active   Interventions for Problem Two Long Term Goal   Educated on the importance of diet for diabetes control  [facility] Long Term Goal (31-90) days  Continue low carb/high protein diet for the next 90 days   [facility] Long Term Goal Start Date  06/11/15     Elisabeth Most, Pharm.D. Pharmacy Resident [place]

## 2015-06-17 ENCOUNTER — Encounter: Payer: Self-pay | Admitting: *Deleted

## 2015-06-18 ENCOUNTER — Encounter: Payer: Self-pay | Admitting: Anesthesiology

## 2015-06-24 NOTE — Discharge Instructions (Signed)

## 2015-06-25 ENCOUNTER — Ambulatory Visit
Admission: RE | Admit: 2015-06-25 | Discharge: 2015-06-25 | Disposition: A | Discharge status: Home / Self Care | Payer: 59 | Source: Ambulatory Visit | Attending: Gastroenterology | Admitting: Gastroenterology

## 2015-06-25 ENCOUNTER — Ambulatory Visit: Payer: 59 | Admitting: Anesthesiology

## 2015-06-25 ENCOUNTER — Encounter
Admission: RE | Disposition: A | Discharge status: Home / Self Care | Payer: Self-pay | Source: Ambulatory Visit | Attending: Gastroenterology

## 2015-06-25 ENCOUNTER — Other Ambulatory Visit: Payer: Self-pay | Admitting: Gastroenterology

## 2015-06-25 DIAGNOSIS — K640 First degree hemorrhoids: Secondary | ICD-10-CM | POA: Diagnosis not present

## 2015-06-25 DIAGNOSIS — Z6838 Body mass index (BMI) 38.0-38.9, adult: Secondary | ICD-10-CM | POA: Insufficient documentation

## 2015-06-25 DIAGNOSIS — D125 Benign neoplasm of sigmoid colon: Secondary | ICD-10-CM | POA: Insufficient documentation

## 2015-06-25 DIAGNOSIS — Z800 Family history of malignant neoplasm of digestive organs: Secondary | ICD-10-CM | POA: Insufficient documentation

## 2015-06-25 DIAGNOSIS — E119 Type 2 diabetes mellitus without complications: Secondary | ICD-10-CM | POA: Insufficient documentation

## 2015-06-25 DIAGNOSIS — I10 Essential (primary) hypertension: Secondary | ICD-10-CM | POA: Insufficient documentation

## 2015-06-25 DIAGNOSIS — E669 Obesity, unspecified: Secondary | ICD-10-CM | POA: Insufficient documentation

## 2015-06-25 DIAGNOSIS — Z1211 Encounter for screening for malignant neoplasm of colon: Secondary | ICD-10-CM | POA: Insufficient documentation

## 2015-06-25 DIAGNOSIS — D123 Benign neoplasm of transverse colon: Secondary | ICD-10-CM | POA: Insufficient documentation

## 2015-06-25 DIAGNOSIS — Z87891 Personal history of nicotine dependence: Secondary | ICD-10-CM | POA: Diagnosis not present

## 2015-06-25 HISTORY — PX: COLONOSCOPY WITH PROPOFOL: SHX5780

## 2015-06-25 HISTORY — PX: POLYPECTOMY: SHX5525

## 2015-06-25 SURGERY — COLONOSCOPY WITH PROPOFOL
Anesthesia: Monitor Anesthesia Care | Wound class: Contaminated

## 2015-06-25 MED ORDER — FENTANYL CITRATE (PF) 100 MCG/2ML IJ SOLN: 25.0000 ug | INTRAMUSCULAR | Status: DC | PRN

## 2015-06-25 MED ORDER — LACTATED RINGERS IV SOLN
INTRAVENOUS | Status: DC
  Administered 2015-06-25 (×2): via INTRAVENOUS

## 2015-06-25 MED ORDER — OXYCODONE HCL 5 MG PO TABS: 5.0000 mg | ORAL_TABLET | Freq: Once | ORAL | Status: DC | PRN

## 2015-06-25 MED ORDER — SODIUM CHLORIDE 0.9 % IV SOLN: INTRAVENOUS | Status: DC

## 2015-06-25 MED ORDER — OXYCODONE HCL 5 MG/5ML PO SOLN: 5.0000 mg | Freq: Once | ORAL | Status: DC | PRN

## 2015-06-25 MED ORDER — PROMETHAZINE HCL 25 MG/ML IJ SOLN: 6.2500 mg | INTRAMUSCULAR | Status: DC | PRN

## 2015-06-25 MED ORDER — PROPOFOL 10 MG/ML IV BOLUS
INTRAVENOUS | Status: DC | PRN
  Administered 2015-06-25: 20 mg via INTRAVENOUS
  Administered 2015-06-25: 40 mg via INTRAVENOUS
  Administered 2015-06-25 (×2): 20 mg via INTRAVENOUS
  Administered 2015-06-25: 100 mg via INTRAVENOUS

## 2015-06-25 MED ORDER — STERILE WATER FOR IRRIGATION IR SOLN
Status: DC | PRN
  Administered 2015-06-25: 08:00:00

## 2015-06-25 MED ORDER — LIDOCAINE HCL (CARDIAC) 20 MG/ML IV SOLN
INTRAVENOUS | Status: DC | PRN
  Administered 2015-06-25: 30 mg via INTRAVENOUS

## 2015-06-25 SURGICAL SUPPLY — 28 items
CANISTER SUCT 1200ML W/VALVE (MISCELLANEOUS) ×4 IMPLANT
FCP ESCP3.2XJMB 240X2.8X (MISCELLANEOUS)
FORCEPS BIOP RAD 4 LRG CAP 4 (CUTTING FORCEPS) ×4 IMPLANT
FORCEPS BIOP RJ4 240 W/NDL (MISCELLANEOUS)
FORCEPS ESCP3.2XJMB 240X2.8X (MISCELLANEOUS) IMPLANT
GOWN CVR UNV OPN BCK APRN NK (MISCELLANEOUS) ×4 IMPLANT
GOWN ISOL THUMB LOOP REG UNIV (MISCELLANEOUS) ×4
HEMOCLIP INSTINCT (CLIP) IMPLANT
INJECTOR VARIJECT VIN23 (MISCELLANEOUS) IMPLANT
KIT CO2 TUBING (TUBING) IMPLANT
KIT DEFENDO VALVE AND CONN (KITS) IMPLANT
KIT ENDO PROCEDURE OLY (KITS) ×4 IMPLANT
LIGATOR MULTIBAND 6SHOOTER MBL (MISCELLANEOUS) IMPLANT
MARKER SPOT ENDO TATTOO 5ML (MISCELLANEOUS) IMPLANT
PAD GROUND ADULT SPLIT (MISCELLANEOUS) IMPLANT
SNARE SHORT THROW 13M SML OVAL (MISCELLANEOUS) ×4 IMPLANT
SNARE SHORT THROW 30M LRG OVAL (MISCELLANEOUS) IMPLANT
SPOT EX ENDOSCOPIC TATTOO (MISCELLANEOUS)
SUCTION POLY TRAP 4CHAMBER (MISCELLANEOUS) IMPLANT
TRAP SUCTION POLY (MISCELLANEOUS) ×4 IMPLANT
TUBING CONN 6MMX3.1M (TUBING)
TUBING SUCTION CONN 0.25 STRL (TUBING) IMPLANT
UNDERPAD 30X60 958B10 (PK) (MISCELLANEOUS) IMPLANT
VALVE BIOPSY ENDO (VALVE) IMPLANT
VARIJECT INJECTOR VIN23 (MISCELLANEOUS)
WATER AUXILLARY (MISCELLANEOUS) IMPLANT
WATER STERILE IRR 250ML POUR (IV SOLUTION) ×4 IMPLANT
WATER STERILE IRR 500ML POUR (IV SOLUTION) IMPLANT

## 2015-06-25 NOTE — Anesthesia Procedure Notes (Signed)
Procedure Name: [place] Performed by: Lind Guest Pre-anesthesia Checklist: Patient being monitored, Patient identified, Emergency Drugs available, Suction available and Timeout performed Patient Re-evaluated:Patient Re-evaluated prior to inductionOxygen Delivery Method: Nasal cannula Preoxygenation: Pre-oxygenation with 100% oxygen

## 2015-06-25 NOTE — Anesthesia Postprocedure Evaluation (Signed)
  Anesthesia Post-op Note  Patient: Kimberly Brady  Procedure(s) Performed: Procedure(s) with comments: COLONOSCOPY WITH PROPOFOL (N/A) - Diabetic - diet controlled POLYPECTOMY  Anesthesia type:MAC  Patient location: PACU  Post pain: Pain level controlled  Post assessment: Post-op Vital signs reviewed, Patient's Cardiovascular Status Stable, Respiratory Function Stable, Patent Airway and No signs of Nausea or vomiting  Post vital signs: Reviewed and stable  Last Vitals:  Filed Vitals:   06/25/15 0812  BP:   Pulse: 61  Temp:   Resp: 19    Level of consciousness: awake, alert  and patient cooperative  Complications: No apparent anesthesia complications

## 2015-06-25 NOTE — H&P (Signed)
  [facility]  [address] [place], [place] [address] Phone: [phone] Fax : [phone]  Primary Care Physician:  Crecencio Mc, MD Primary Gastroenterologist:  Dr. Allen Norris  Pre-Procedure History & Physical: HPI:  Kimberly Brady is a 64 y.o. female is here for an colonoscopy.   Past Medical History  Diagnosis Date  . Diabetes mellitus     type 2  . Obesity (BMI 30-39.9)   . Hypertension     situational, pt states only at doctors office, never medicated    Past Surgical History  Procedure Laterality Date  . Total abdominal hysterectomy w/ bilateral salpingoophorectomy  1986    for polycystic ovaries,endometriosis  . Cholecystectomy  2009    lapaoscopic  . Eye surgery  Jan 2013    serial cataract extreaction, intraocular lense  . Small intestine surgery  2008    Biliary stent placement,  removal    Prior to Admission medications   Medication Sig Start Date End Date Taking? Authorizing Provider  Red Yeast Rice Extract 600 MG CAPS Take 600 mg by mouth 2 (two) times daily.      Historical Provider, MD  TRUEPLUS LANCETS 30G MISC TEST ONCE DAILY Patient not taking: Reported on 06/25/2015 01/14/15   Crecencio Mc, MD  TRUETEST TEST test strip TEST ONCE DAILY 01/14/15   Crecencio Mc, MD    Allergies as of 04/19/2015  . (No Known Allergies)    Family History  Problem Relation Age of Onset  . Diabetes Mother     type 2  . Cancer Mother     colon,breast  . Cancer Father     lung  . Breast cancer Sister   . Colon cancer Brother   . Lung cancer Brother     Social History   Social History  . Marital Status: Married    Spouse Name: N/A  . Number of Children: N/A  . Years of Education: N/A   Occupational History  . Not on file.   Social History Main Topics  . Smoking status: Former Research scientist (life sciences)  . Smokeless tobacco: Never Used  . Alcohol Use: No  . Drug Use: No  . Sexual Activity: Not on file   Other Topics Concern  . Not on file     Social History Narrative   THERE ARE LAB RESULTS IN THE PAPER CHART BUT NOT UPDATED IN COMPUTER SYSTEM AS I WAS UNABLE TO READ THE RESULTS ACCURATELY...             Review of Systems: See HPI, otherwise negative ROS  Physical Exam: BP 138/68 mmHg  Pulse 64  Temp(Src) 97.7 F (36.5 C) (Temporal)  Resp 16  Ht 5\' 3"  (1.6 m)  Wt 219 lb (99.338 kg)  BMI 38.80 kg/m2  SpO2 95% General:   Alert,  pleasant and cooperative in NAD Head:  Normocephalic and atraumatic. Neck:  Supple; no masses or thyromegaly. Lungs:  Clear throughout to auscultation.    Heart:  Regular rate and rhythm. Abdomen:  Soft, nontender and nondistended. Normal bowel sounds, without guarding, and without rebound.   Neurologic:  Alert and  oriented x4;  grossly normal neurologically.  Impression/Plan: Kimberly Brady is here for an colonoscopy to be performed for family history of colon cancer.  Risks, benefits, limitations, and alternatives regarding  colonoscopy have been reviewed with the patient.  Questions have been answered.  All parties agreeable.   Ollen Bowl, MD  06/25/2015, 7:32 AM

## 2015-06-25 NOTE — Anesthesia Preprocedure Evaluation (Signed)
Anesthesia Evaluation    Airway Mallampati: II  TM Distance: >3 FB Neck ROM: Full    Dental no notable dental hx.    Pulmonary former smoker,  breath sounds clear to auscultation  Pulmonary exam normal       Cardiovascular hypertension, Normal cardiovascular examRhythm:Regular Rate:Normal     Neuro/Psych    GI/Hepatic   Endo/Other  diabetes  Renal/GU      Musculoskeletal   Abdominal   Peds  Hematology   Anesthesia Other Findings   Reproductive/Obstetrics                             Anesthesia Physical Anesthesia Plan  ASA: II  Anesthesia Plan: MAC   Post-op Pain Management:    Induction: Intravenous  Airway Management Planned:   Additional Equipment:   Intra-op Plan:   Post-operative Plan: Extubation in OR  Informed Consent: I have reviewed the patients History and Physical, chart, labs and discussed the procedure including the risks, benefits and alternatives for the proposed anesthesia with the patient or authorized representative who has indicated his/her understanding and acceptance.   Dental advisory given  Plan Discussed with: CRNA  Anesthesia Plan Comments:         Anesthesia Quick Evaluation

## 2015-06-25 NOTE — Transfer of Care (Signed)
Immediate Anesthesia Transfer of Care Note  Patient: Kimberly Brady  Procedure(s) Performed: Procedure(s) with comments: COLONOSCOPY WITH PROPOFOL (N/A) - Diabetic - diet controlled  Patient Location: PACU  Anesthesia Type: MAC  Level of Consciousness: awake, alert  and patient cooperative  Airway and Oxygen Therapy: Patient Spontanous Breathing and Patient connected to supplemental oxygen  Post-op Assessment: Post-op Vital signs reviewed, Patient's Cardiovascular Status Stable, Respiratory Function Stable, Patent Airway and No signs of Nausea or vomiting  Post-op Vital Signs: Reviewed and stable  Complications: No apparent anesthesia complications

## 2015-06-25 NOTE — Op Note (Signed)
[facility] Gastroenterology Patient Name: Kimberly Brady Procedure Date: 06/25/2015 7:32 AM MRN: [id number] Account #: [id number] Date of Birth: 10-11-1951 Admit Type: Outpatient Age: 64 Room: [facility] OR ROOM 01 Gender: Female Note Status: Finalized Procedure:         Colonoscopy Indications:       Family history of colon cancer Providers:         Lucilla Lame, MD Referring MD:      Deborra Medina, MD (Referring MD) Medicines:         Propofol per Anesthesia Complications:     No immediate complications. Procedure:         Pre-Anesthesia Assessment:                    - Prior to the procedure, a History and Physical was                     performed, and patient medications and allergies were                     reviewed. The patient's tolerance of previous anesthesia                     was also reviewed. The risks and benefits of the procedure                     and the sedation options and risks were discussed with the                     patient. All questions were answered, and informed consent                     was obtained. Prior Anticoagulants: The patient has taken                     no previous anticoagulant or antiplatelet agents. ASA                     Grade Assessment: II - A patient with mild systemic                     disease. After reviewing the risks and benefits, the                     patient was deemed in satisfactory condition to undergo                     the procedure.                    After obtaining informed consent, the colonoscope was                     passed under direct vision. Throughout the procedure, the                     patient's blood pressure, pulse, and oxygen saturations                     were monitored continuously. The Olympus CF-HQ190L                     Colonoscope (S#. [id number]) was introduced through the anus  and advanced to the the cecum, identified by appendiceal                     orifice  and ileocecal valve. The colonoscopy was performed                     without difficulty. The patient tolerated the procedure                     well. The quality of the bowel preparation was excellent. Findings:      The perianal and digital rectal examinations were normal.      Two sessile polyps were found in the transverse colon. The polyps were 4       to 5 mm in size. These polyps were removed with a cold biopsy forceps.       Resection and retrieval were complete.      A 4 mm polyp was found in the sigmoid colon. The polyp was sessile. The       polyp was removed with a cold biopsy forceps. Resection and retrieval       were complete.      A 8 mm polyp was found in the sigmoid colon. The polyp was sessile. The       polyp was removed with a cold snare. Resection and retrieval were       complete.      Non-bleeding internal hemorrhoids were found during retroflexion. The       hemorrhoids were Grade I (internal hemorrhoids that do not prolapse). Impression:        - Two 4 to 5 mm polyps in the transverse colon. Resected                     and retrieved.                    - One 4 mm polyp in the sigmoid colon. Resected and                     retrieved.                    - One 8 mm polyp in the sigmoid colon. Resected and                     retrieved.                    - Non-bleeding internal hemorrhoids. Recommendation:    - Await pathology results.                    - Repeat colonoscopy in 5 years for surveillance. Procedure Code(s): --- Professional ---                    [phone], Colonoscopy, flexible; with removal of tumor(s),                     polyp(s), or other lesion(s) by snare technique                    30076, 34, Colonoscopy, flexible; with biopsy, single or                     multiple Diagnosis Code(s): --- Professional ---  Z80.0, Family history of malignant neoplasm of digestive                     organs                    D12.3,  Benign neoplasm of transverse colon                    D12.5, Benign neoplasm of sigmoid colon CPT copyright 2014 American Medical Association. All rights reserved. The codes documented in this report are preliminary and upon coder review may  be revised to meet current compliance requirements. Lucilla Lame, MD 06/25/2015 7:59:34 AM This report has been signed electronically. Number of Addenda: 0 Note Initiated On: 06/25/2015 7:32 AM Scope Withdrawal Time: 0 hours 8 minutes 16 seconds  Total Procedure Duration: 0 hours 11 minutes 26 seconds       [facility]

## 2015-06-29 ENCOUNTER — Encounter: Payer: Self-pay | Admitting: Gastroenterology

## 2015-06-30 ENCOUNTER — Encounter: Payer: Self-pay | Admitting: Gastroenterology

## 2015-07-13 ENCOUNTER — Encounter: Payer: Self-pay | Admitting: Internal Medicine

## 2015-07-13 LAB — HM COLONOSCOPY

## 2015-08-09 ENCOUNTER — Other Ambulatory Visit: Payer: Self-pay | Admitting: Internal Medicine

## 2015-09-13 ENCOUNTER — Other Ambulatory Visit: Payer: Self-pay | Admitting: Pharmacist

## 2015-09-13 NOTE — Patient Outreach (Signed)
[facility]) Care Management  [place]   09/13/2015  Kimberly Brady 06-27-1951 [id number]  Subjective: Kimberly Brady is a 64yo who is participating in the Employee Link to [employer].  I spoke to patient on the phone to get an update regarding diabetes.  Patient continues to monitor blood glucose two to three times per week and reports blood glucose readings are between 95 to 129m/dL.  Patient denies hypoglycemia.  Patient is not currently prescribed any diabetes medications.  She reports she continues to try to watch carbohydrate intake.  Patient reports she is not consistent with exercise but she is currently walking at least one day per week.  Patient has follow up appointment with primary care provider on 10/29/15.    Objective:   Current Medications: Current Outpatient Prescriptions  Medication Sig Dispense Refill  . Red Yeast Rice Extract 600 MG CAPS Take 600 mg by mouth 2 (two) times daily.      . TRUEPLUS LANCETS 30G MISC TEST ONCE DAILY 100 each 1  . TRUETEST TEST test strip TEST ONCE DAILY 100 each 1   No current facility-administered medications for this visit.   Functional Status: In your present state of health, do you have any difficulty performing the following activities: 06/25/2015 06/11/2015  Hearing? N N  Vision? N N  Difficulty concentrating or making decisions? N N  Walking or climbing stairs? N N  Dressing or bathing? N N  Doing errands, shopping? - NScientist, forensicand eating ? - N  Using the Toilet? - N  In the past six months, have you accidently leaked urine? - N  Do you have problems with loss of bowel control? - N  Managing your Medications? - N  Managing your Finances? - N  Housekeeping or managing your Housekeeping? - N   Fall/Depression Screening: PHQ 2/9 Scores 06/11/2015  PHQ - 2 Score 0    Assessment: 1.  Diabetes: Reported blood glucose readings at goal.  Currently controlled by diet and exercise.   Patient denies hypoglycemia, but is able to report appropriate hypoglycemia management.    Plan: 1.  Patient will continue to work to increase exercise to 15 minutes three days per week.   2.  She will continue to monitor carbohydrate intake during holiday season.   3.  Follow up pharmacy visit scheduled for 12/02/14 at 1:00PM.    [facility] Problem [facility]        Most Recent Value   Care Plan Problem One  Exercise   Role Documenting the Problem One  Clinical Pharmacist   Care Plan for Problem One  Active   THN Long Term Goal (31-90 days)  Patient will increase exercise to 15 minutes three days a week in the next 90 days per patient report    [facility]al Start Date  09/13/15   [facility]  -- [Patient did not meet goal ]   Interventions for Problem One Long Term Goal  Reinforced the importance of exercise for diabetes control.    THN CM Care Plan Problem Two        Most Recent Value   Care Plan Problem Two  Diet   Role Documenting the Problem Two  Clinical Pharmacist   Care Plan for Problem Two  Active   Interventions for Problem Two Long Term Goal   Reinforced the importance of diet for diabetes control   THN Long Term Goal (  31-90) days  Continue to monitor carbohydrate intake for the next 90 days per patient report.    THN Long Term Goal Start Date  09/13/15   [facility] Long Term Goal Met Date  09/13/15     Elisabeth Most, Pharm.D. Pharmacy Resident [place] [phone]

## 2015-10-29 ENCOUNTER — Ambulatory Visit: Payer: 59 | Admitting: Internal Medicine

## 2015-11-26 ENCOUNTER — Ambulatory Visit: Payer: Self-pay | Admitting: Pharmacist

## 2015-11-29 ENCOUNTER — Telehealth: Payer: Self-pay | Admitting: *Deleted

## 2015-11-29 NOTE — Telephone Encounter (Signed)
Patient  requested to have all lab orders needed by Dr. Derrel Nip placed. She will have her labs drawn at [facility]. Her upcoming appt.will be 12/31/15.  Contact [phone]

## 2015-11-29 NOTE — Telephone Encounter (Signed)
Pt's labs have already been ordered

## 2015-12-03 ENCOUNTER — Ambulatory Visit: Payer: 59 | Admitting: Pharmacist

## 2015-12-03 ENCOUNTER — Ambulatory Visit: Payer: 59 | Admitting: Internal Medicine

## 2015-12-24 ENCOUNTER — Telehealth: Payer: Self-pay

## 2015-12-24 ENCOUNTER — Other Ambulatory Visit
Admission: RE | Admit: 2015-12-24 | Discharge: 2015-12-24 | Disposition: A | Discharge status: Home / Self Care | Payer: 59 | Source: Ambulatory Visit | Attending: Internal Medicine | Admitting: Internal Medicine

## 2015-12-24 DIAGNOSIS — R5383 Other fatigue: Secondary | ICD-10-CM | POA: Diagnosis not present

## 2015-12-24 DIAGNOSIS — R109 Unspecified abdominal pain: Secondary | ICD-10-CM | POA: Diagnosis not present

## 2015-12-24 DIAGNOSIS — E119 Type 2 diabetes mellitus without complications: Secondary | ICD-10-CM | POA: Diagnosis not present

## 2015-12-24 DIAGNOSIS — Z79899 Other long term (current) drug therapy: Secondary | ICD-10-CM | POA: Insufficient documentation

## 2015-12-24 LAB — HEPATIC FUNCTION PANEL
ALK PHOS: 54 U/L (ref 38–126)
ALT: 23 U/L (ref 14–54)
AST: 17 U/L (ref 15–41)
Albumin: 4.2 g/dL (ref 3.5–5.0)
BILIRUBIN TOTAL: 0.7 mg/dL (ref 0.3–1.2)
Bilirubin, Direct: <0.1 mg/dL — ABNORMAL LOW (ref 0.1–0.5)
Total Protein: 7.5 g/dL (ref 6.5–8.1)

## 2015-12-24 LAB — LIPASE, BLOOD: Lipase: 29 U/L (ref 11–51)

## 2015-12-24 NOTE — Telephone Encounter (Signed)
Why Hep A?  That is not a common test and I see  They are already in process?  Usually gets A1c and CMET

## 2015-12-24 NOTE — Telephone Encounter (Signed)
Spoke with the lab tech and she stated that the pt had future results for a Hep A test, she was not aware that the pt needed additional orders for labs.

## 2015-12-24 NOTE — Telephone Encounter (Signed)
Pt is requesting to have her A1C, Lipase, Hepatic Function Panel, Hep A antibody labs ordered. Pt coming in on 12/31/15 for and OV.Please advise, thanks

## 2015-12-24 NOTE — Telephone Encounter (Signed)
Where di the patient have them drawn? The chart states that the labs were collected,  So please confirm with the lab and if not, she will need to have them done the day she comes in for her appt

## 2015-12-24 NOTE — Telephone Encounter (Signed)
She had labs done in [facility] lab

## 2015-12-25 LAB — HEPATITIS A ANTIBODY, IGM: Hep A IgM: NEGATIVE

## 2015-12-25 LAB — HEMOGLOBIN A1C: Hgb A1c MFr Bld: 6.5 % — ABNORMAL HIGH (ref 4.0–6.0)

## 2015-12-27 ENCOUNTER — Encounter: Payer: Self-pay | Admitting: Internal Medicine

## 2015-12-31 ENCOUNTER — Encounter: Payer: Self-pay | Admitting: Internal Medicine

## 2015-12-31 ENCOUNTER — Ambulatory Visit (INDEPENDENT_AMBULATORY_CARE_PROVIDER_SITE_OTHER): Payer: 59 | Admitting: Internal Medicine

## 2015-12-31 VITALS — BP 140/84 | HR 57 | Temp 98.0°F | Resp 12 | Ht 63.0 in | Wt 219.5 lb

## 2015-12-31 DIAGNOSIS — E785 Hyperlipidemia, unspecified: Secondary | ICD-10-CM

## 2015-12-31 DIAGNOSIS — E1169 Type 2 diabetes mellitus with other specified complication: Secondary | ICD-10-CM

## 2015-12-31 DIAGNOSIS — I10 Essential (primary) hypertension: Secondary | ICD-10-CM | POA: Diagnosis not present

## 2015-12-31 DIAGNOSIS — E669 Obesity, unspecified: Secondary | ICD-10-CM

## 2015-12-31 DIAGNOSIS — E119 Type 2 diabetes mellitus without complications: Secondary | ICD-10-CM | POA: Diagnosis not present

## 2015-12-31 LAB — BASIC METABOLIC PANEL
BUN: 19 mg/dL (ref 6–23)
CALCIUM: 9.3 mg/dL (ref 8.4–10.5)
CHLORIDE: 102 meq/L (ref 96–112)
CO2: 29 mEq/L (ref 19–32)
CREATININE: 0.91 mg/dL (ref 0.40–1.20)
GFR: 66.47 mL/min (ref >60.00)
Glucose, Bld: 128 mg/dL — ABNORMAL HIGH (ref 70–99)
Potassium: 4.5 mEq/L (ref 3.5–5.1)
Sodium: 138 mEq/L (ref 135–145)

## 2015-12-31 LAB — MICROALBUMIN / CREATININE URINE RATIO
CREATININE, U: 127.3 mg/dL
Microalb Creat Ratio: 0.5 mg/g (ref 0.0–30.0)

## 2015-12-31 NOTE — Progress Notes (Signed)
Pre-visit discussion using our clinic review tool. No additional management support is needed unless otherwise documented below in the visit note.  

## 2015-12-31 NOTE — Progress Notes (Signed)
Subjective:  Patient ID: Kimberly Brady, female    DOB: Apr 24, 1951  Age: 65 y.o. MRN: [id number]  CC: The primary encounter diagnosis was Diabetes mellitus without complication ([place]). Diagnoses of Diabetes mellitus type 2 in obese [facility]), Essential hypertension, Hyperlipidemia with target LDL less than 70, and Obesity (BMI 30-39.9) were also pertinent to this visit.  HPI Kimberly Brady presents for  Follow up on Type 2 DM, ow diet controlled,  obesity and hypertension . She feels generally well, is walking several times per week and checking blood sugars once daily at variable times.  BS have been under 130 fasting and < 150 post prandially.  Denies any recent hypoglyemic events. Following a carbohydrate modified diet 6 days per week. Denies numbness, burning and tingling of extremities. Appetite is good.      Lab Results  Component Value Date   HGBA1C 6.5* 12/24/2015     Outpatient Prescriptions Prior to Visit  Medication Sig Dispense Refill  . Red Yeast Rice Extract 600 MG CAPS Take 600 mg by mouth 2 (two) times daily. Reported on 12/31/2015    . TRUEPLUS LANCETS 30G MISC TEST ONCE DAILY (Patient not taking: Reported on 12/31/2015) 100 each 1  . TRUETEST TEST test strip TEST ONCE DAILY (Patient not taking: Reported on 12/31/2015) 100 each 1   No facility-administered medications prior to visit.    Review of Systems;  Patient denies headache, fevers, malaise, unintentional weight loss, skin rash, eye pain, sinus congestion and sinus pain, sore throat, dysphagia,  hemoptysis , cough, dyspnea, wheezing, chest pain, palpitations, orthopnea, edema, abdominal pain, nausea, melena, diarrhea, constipation, flank pain, dysuria, hematuria, urinary  Frequency, nocturia, numbness, tingling, seizures,  Focal weakness, Loss of consciousness,  Tremor, insomnia, depression, anxiety, and suicidal ideation.      Objective:  BP 140/84 mmHg  Pulse 57  Temp(Src) 98 F (36.7 C) (Oral)   Resp 12  Ht 5\' 3"  (1.6 m)  Wt 219 lb 8 oz (99.565 kg)  BMI 38.89 kg/m2  SpO2 98%  BP Readings from Last 3 Encounters:  12/31/15 140/84  06/25/15 138/68  06/11/15 145/82    Wt Readings from Last 3 Encounters:  12/31/15 219 lb 8 oz (99.565 kg)  06/25/15 219 lb (99.338 kg)  06/11/15 224 lb 8 oz (101.833 kg)    General appearance: alert, cooperative and appears stated age Ears: normal TM's and external ear canals both ears Throat: lips, mucosa, and tongue normal; teeth and gums normal Neck: no adenopathy, no carotid bruit, supple, symmetrical, trachea midline and thyroid not enlarged, symmetric, no tenderness/mass/nodules Back: symmetric, no curvature. ROM normal. No CVA tenderness. Lungs: clear to auscultation bilaterally Heart: regular rate and rhythm, S1, S2 normal, no murmur, click, rub or gallop Abdomen: soft, non-tender; bowel sounds normal; no masses,  no organomegaly Pulses: 2+ and symmetric Skin: Skin color, texture, turgor normal. No rashes or lesions Lymph nodes: Cervical, supraclavicular, and axillary nodes normal.  Lab Results  Component Value Date   HGBA1C 6.5* 12/24/2015   HGBA1C 6.3* 10/30/2014   HGBA1C 6.4* 08/17/2014    Lab Results  Component Value Date   CREATININE 0.91 12/31/2015   CREATININE 0.76 04/09/2015   CREATININE 0.77 12/18/2014    Lab Results  Component Value Date   WBC 2.9* 05/10/2012   HGB 14.8 05/10/2012   HCT 42.8 05/10/2012   PLT 133* 05/10/2012   GLUCOSE 128* 12/31/2015   CHOL 180 04/09/2015   TRIG 198.0* 04/09/2015   HDL 43.90 04/09/2015  LDLCALC 97 04/09/2015   ALT 23 12/24/2015   AST 17 12/24/2015   NA 138 12/31/2015   K 4.5 12/31/2015   CL 102 12/31/2015   CREATININE 0.91 12/31/2015   BUN 19 12/31/2015   CO2 29 12/31/2015   HGBA1C 6.5* 12/24/2015   MICROALBUR <0.7 12/31/2015    No results found.  Assessment & Plan:   Problem List Items Addressed This Visit    Hypertension    Slight elevation noted today,  But  home readings have been normal. Renal function stable, no changes today.   . Lab Results  Component Value Date   CREATININE 0.91 12/31/2015   Lab Results  Component Value Date   NA 138 12/31/2015   K 4.5 12/31/2015   CL 102 12/31/2015   CO2 29 12/31/2015          Diabetes mellitus type 2 in obese ([place])    Well-controlled without Victoza,  Urine Microalb/cr ratio was normal  .  She is due for annual  eye exams and her foot exam is normal. Using RYR due to history of prior statin intolerance..  Lab Results  Component Value Date   HGBA1C 6.5* 12/24/2015   Lab Results  Component Value Date   MICROALBUR <0.7 12/31/2015    Lab Results  Component Value Date   CREATININE 0.91 12/31/2015                 Hyperlipidemia with target LDL less than 70    LDL is 108  on Red yeast rice .  No changes today.   She has statin intolerance.  Continue daily aspirin.  Lab Results  Component Value Date   CHOL 180 04/09/2015   HDL 43.90 04/09/2015   LDLCALC 97 04/09/2015   TRIG 198.0* 04/09/2015   CHOLHDL 4 04/09/2015   Lab Results  Component Value Date   ALT 23 12/24/2015   AST 17 12/24/2015   ALKPHOS 54 12/24/2015   BILITOT 0.7 12/24/2015             Obesity (BMI 30-39.9)    I have addressed  BMI and recommended wt loss of 10% of body weigh over the next 6 months using a low glycemic index diet and regular exercise a minimum of 5 days per week.         Other Visit Diagnoses    Diabetes mellitus without complication ([place])    -  Primary    Relevant Orders    Basic metabolic panel (Completed)    Urine Microalbumin w/creat. ratio (Completed)       I am having Ms. Venning maintain her Red Yeast Rice Extract, TRUEPLUS LANCETS 30G, and TRUETEST TEST.  No orders of the defined types were placed in this encounter.    There are no discontinued medications.  Follow-up: No Follow-up on file.   Crecencio Mc, MD

## 2016-01-02 ENCOUNTER — Encounter: Payer: Self-pay | Admitting: Internal Medicine

## 2016-01-02 NOTE — Assessment & Plan Note (Signed)
LDL is 108  on Red yeast rice .  No changes today.   She has statin intolerance.  Continue daily aspirin.  Lab Results  Component Value Date   CHOL 180 04/09/2015   HDL 43.90 04/09/2015   LDLCALC 97 04/09/2015   TRIG 198.0* 04/09/2015   CHOLHDL 4 04/09/2015   Lab Results  Component Value Date   ALT 23 12/24/2015   AST 17 12/24/2015   ALKPHOS 54 12/24/2015   BILITOT 0.7 12/24/2015

## 2016-01-02 NOTE — Assessment & Plan Note (Signed)
I have addressed  BMI and recommended wt loss of 10% of body weigh over the next 6 months using a low glycemic index diet and regular exercise a minimum of 5 days per week.   

## 2016-01-02 NOTE — Assessment & Plan Note (Signed)
Well-controlled without Victoza,  Urine Microalb/cr ratio was normal  .  She is due for annual  eye exams and her foot exam is normal. Using RYR due to history of prior statin intolerance..  Lab Results  Component Value Date   HGBA1C 6.5* 12/24/2015   Lab Results  Component Value Date   MICROALBUR <0.7 12/31/2015    Lab Results  Component Value Date   CREATININE 0.91 12/31/2015

## 2016-01-02 NOTE — Assessment & Plan Note (Signed)
Slight elevation noted today,  But home readings have been normal. Renal function stable, no changes today.   . Lab Results  Component Value Date   CREATININE 0.91 12/31/2015   Lab Results  Component Value Date   NA 138 12/31/2015   K 4.5 12/31/2015   CL 102 12/31/2015   CO2 29 12/31/2015

## 2016-01-07 ENCOUNTER — Ambulatory Visit: Payer: Self-pay | Admitting: Pharmacist

## 2016-01-21 ENCOUNTER — Encounter: Payer: Self-pay | Admitting: Pharmacist

## 2016-01-21 ENCOUNTER — Other Ambulatory Visit: Payer: Self-pay | Admitting: Pharmacist

## 2016-01-22 NOTE — Patient Outreach (Signed)
Kimberly Brady [facility]) Care Management  [name]   01/21/16  Kimberly Brady 01/05/1951 [id number]  Subjective: Patient presents today for 3 month diabetes follow-up as part of the employer-sponsored Link to Wellness program.  Patient reports she is currently following a diabetic diet.  She reports she is not exercising regularly.  She is not currently prescribed diabetes medications.  Patient monitors blood glucose 2 to 3 times per week.  Patient reports blood glucose average is approximately 100 to 130 in the mornings and 85 to 90 mg/dL in the evenings.  Patient denies hypoglycemia.  Most recent MD follow-up was 12/31/15.  No med changes or major health changes at this time.   Objective:  Blood pressure = 144/85  Current Medications: Current Outpatient Prescriptions  Medication Sig Dispense Refill  . TRUEPLUS LANCETS 30G MISC TEST ONCE DAILY 100 each 1  . TRUETEST TEST test strip TEST ONCE DAILY 100 each 1  . Red Yeast Rice Extract 600 MG CAPS Take 600 mg by mouth 2 (two) times daily. Reported on 01/21/2016     No current facility-administered medications for this visit.    Functional Status: In your present state of health, do you have any difficulty performing the following activities: 01/21/2016 06/25/2015  Hearing? N N  Vision? N N  Difficulty concentrating or making decisions? N N  Walking or climbing stairs? N N  Dressing or bathing? N N  Doing errands, shopping? N -  Preparing Food and eating ? - -  Using the Toilet? - -  In the past six months, have you accidently leaked urine? - -  Do you have problems with loss of bowel control? - -  Managing your Medications? - -  Managing your Finances? - -  Housekeeping or managing your Housekeeping? - -    Fall/Depression Screening: PHQ 2/9 Scores 01/21/2016 06/11/2015  PHQ - 2 Score 0 0    Assessment: 1.  Diabetes: Most recent A1c was 6.5% on 12/31/15.  A1c at goal of less than 7%.  Currently controlled by  diet.  Patient may benefit from the initiation of metformin.    2.  Hypertension:  Not at goal of less than 140/90 at visit today.  Patient is not currently on any antihypertensive medications.  Patient reports she experiences white coat syndrome.  Patient reports checking blood pressure at home/work and reports readings of 120-130/80.     Plan: 1.  Patient set goal to increase exercise to walking 30 minutes daily.   2.  Patient will continue to follow diabetic diet.   3.  Follow up phone call scheduled for 04/14/16.     THN CM Care Plan Problem One        Most Recent Value   Care Plan Problem One  Exercise   Role Documenting the Problem One  Clinical Pharmacist   Care Plan for Problem One  Active   THN Long Term Goal (31-90 days)  Patient will increase exercise to 15 minutes three days a week in the next 90 days per patient report    [place] Term Goal Start Date  09/13/15   [facility] Long Term Goal Met Date  -- [goal renewed]   Interventions for Problem One Long Term Goal  Reviewed importance of exercise for diabetes control.  Discussed ADA recommendations to exercise 150 minutes per week.  Patient reports she wants to work up to walking 30 minutes daily.     [facility] CM Care Plan Problem Two  Most Recent Value   Care Plan Problem Two  Diet   Role Documenting the Problem Two  Clinical Pharmacist   Care Plan for Problem Two  Active   Interventions for Problem Two Long Term Goal   Patient continues to report following a diabetic diet.    THN Long Term Goal (31-90) days  Continue to monitor carbohydrate intake for the next 90 days per patient report.    THN Long Term Goal Start Date  09/13/15   [facility] Long Term Goal Met Date  01/22/16      Elisabeth Most, Pharm.D. Pharmacy Resident [place] [phone]

## 2016-02-07 ENCOUNTER — Other Ambulatory Visit: Payer: Self-pay | Admitting: Internal Medicine

## 2016-04-14 ENCOUNTER — Ambulatory Visit: Payer: Self-pay | Admitting: Pharmacist

## 2016-04-21 ENCOUNTER — Other Ambulatory Visit: Payer: Self-pay | Admitting: Pharmacist

## 2016-04-21 NOTE — Patient Outreach (Signed)
[facility]) Care Management  [place]   04/21/2016  Kimberly Brady 01/26/1951 [id number]  Subjective: Kimberly Brady is a 65 yo who is participating in the Employee Link to [employer].  I spoke to patient on the phone to follow up.  Current diabetes regimen includes diet and exercise; patient is not currently prescribed any medications.  Patient reports she is currently following a diabetic diet >75% of the time.  Patient reports she is not consistent with exercise, but she is currently walking 2-3 times per week for approximately 10 to 15 minutes each time.  Most recent MD follow-up was 12/31/15.  Patient has a pending appt for 07/14/16.  No med changes or major health changes at this time.  Patient reports monitoring her blood sugar once a week.  Patient reports blood sugar average is around 120-130 mg/dL in the morning and ~100 mg/dL in the afternoon.  Patient denies hypoglycemia.      Objective:   Encounter Medications: Outpatient Encounter Prescriptions as of 04/21/2016  Medication Sig  . TRUE METRIX BLOOD GLUCOSE TEST test strip TEST ONCE DAILY  . TRUEPLUS LANCETS 30G MISC TEST ONCE DAILY  . Red Yeast Rice Extract 600 MG CAPS Take 600 mg by mouth 2 (two) times daily. Reported on 04/21/2016   No facility-administered encounter medications on file as of 04/21/2016.   Functional Status: In your present state of health, do you have any difficulty performing the following activities: 01/21/2016 06/25/2015  Hearing? N N  Vision? N N  Difficulty concentrating or making decisions? N N  Walking or climbing stairs? N N  Dressing or bathing? N N  Doing errands, shopping? N -  Preparing Food and eating ? - -  Using the Toilet? - -  In the past six months, have you accidently leaked urine? - -  Do you have problems with loss of bowel control? - -  Managing your Medications? - -  Managing your Finances? - -  Housekeeping or managing your Housekeeping? - -     Fall/Depression Screening: PHQ 2/9 Scores 01/21/2016 06/11/2015  PHQ - 2 Score 0 0    Assessment: 1.  Diabetes:  Most recent A1c was 6.5% on 12/31/15.  A1c at goal of less than 7%.  Reported blood glucose readings also at goal.  Currently controlled by diet and exercise.    2.  Hypertension: Patient reports checking blood pressure recently with reading of 127/84.  Blood pressure at goal of <140/90.    Plan: 1.  Patient set goal to increase consistency with exercise and will walk 15 minutes at least 3 days per week.   2.  Patient to continue to follow diabetic diet >75% of the time.   3.  LTW Follow up on 08/04/16.    THN CM Care Plan Problem One        Most Recent Value   Care Plan Problem One  Exercise   Role Documenting the Problem One  Clinical Pharmacist   Care Plan for Problem One  Active   THN Long Term Goal (31-90 days)  Patient will consistently walk 15 minutes at least 3 days per week over the next 90 days per patient report   THN Long Term Goal Start Date  04/21/16   Interventions for Problem One Long Term Goal  Reviewed importance of exercise for diabetes control.  Discussed ADA recommendations to exercise 150 minutes per week.      Elisabeth Most, Pharm.D. Pharmacy Resident [place]  [employer] [phone]

## 2016-06-16 ENCOUNTER — Other Ambulatory Visit
Admission: RE | Admit: 2016-06-16 | Discharge: 2016-06-16 | Disposition: A | Discharge status: Home / Self Care | Payer: 59 | Source: Ambulatory Visit | Attending: Internal Medicine | Admitting: Internal Medicine

## 2016-06-16 ENCOUNTER — Telehealth: Payer: Self-pay | Admitting: Internal Medicine

## 2016-06-16 DIAGNOSIS — Z79899 Other long term (current) drug therapy: Secondary | ICD-10-CM | POA: Diagnosis not present

## 2016-06-16 DIAGNOSIS — E119 Type 2 diabetes mellitus without complications: Secondary | ICD-10-CM | POA: Insufficient documentation

## 2016-06-16 DIAGNOSIS — R17 Unspecified jaundice: Secondary | ICD-10-CM

## 2016-06-16 DIAGNOSIS — R109 Unspecified abdominal pain: Secondary | ICD-10-CM | POA: Diagnosis not present

## 2016-06-16 DIAGNOSIS — R5383 Other fatigue: Secondary | ICD-10-CM | POA: Insufficient documentation

## 2016-06-16 LAB — COMPREHENSIVE METABOLIC PANEL
ALBUMIN: 4 g/dL (ref 3.5–5.0)
ALT: 627 U/L — ABNORMAL HIGH (ref 14–54)
ANION GAP: 7 (ref 5–15)
AST: 300 U/L — ABNORMAL HIGH (ref 15–41)
Alkaline Phosphatase: 219 U/L — ABNORMAL HIGH (ref 38–126)
BUN: 9 mg/dL (ref 6–20)
CHLORIDE: 105 mmol/L (ref 101–111)
CO2: 25 mmol/L (ref 22–32)
Calcium: 8.9 mg/dL (ref 8.9–10.3)
Creatinine, Ser: 0.83 mg/dL (ref 0.44–1.00)
GFR calc Af Amer: >60 mL/min (ref >60)
GFR calc non Af Amer: >60 mL/min (ref >60)
GLUCOSE: 173 mg/dL — AB (ref 65–99)
POTASSIUM: 3.8 mmol/L (ref 3.5–5.1)
SODIUM: 137 mmol/L (ref 135–145)
Total Bilirubin: 4.1 mg/dL — ABNORMAL HIGH (ref 0.3–1.2)
Total Protein: 7.5 g/dL (ref 6.5–8.1)

## 2016-06-16 LAB — CBC WITH DIFFERENTIAL/PLATELET
BASOS ABS: 0 10*3/uL (ref 0–0.1)
Basophils Relative: 1 %
Eosinophils Absolute: 0 10*3/uL (ref 0–0.7)
Eosinophils Relative: 0 %
HEMATOCRIT: 43.7 % (ref 35.0–47.0)
Hemoglobin: 14.7 g/dL (ref 12.0–16.0)
LYMPHS ABS: 0.4 10*3/uL — AB (ref 1.0–3.6)
LYMPHS PCT: 11 %
MCH: 30.1 pg (ref 26.0–34.0)
MCHC: 33.6 g/dL (ref 32.0–36.0)
MCV: 89.5 fL (ref 80.0–100.0)
MONO ABS: 0.3 10*3/uL (ref 0.2–0.9)
MONOS PCT: 8 %
NEUTROS ABS: 3.3 10*3/uL (ref 1.4–6.5)
Neutrophils Relative %: 80 %
Platelets: 142 10*3/uL — ABNORMAL LOW (ref 150–440)
RBC: 4.88 MIL/uL (ref 3.80–5.20)
RDW: 13.4 % (ref 11.5–14.5)
WBC: 4.1 10*3/uL (ref 3.6–11.0)

## 2016-06-16 LAB — LIPASE, BLOOD: Lipase: 28 U/L (ref 11–51)

## 2016-06-16 NOTE — Telephone Encounter (Signed)
I agree,  I can't  REALLY MANAGE ON MY DAY OFF and she really should be seen somewhere,  But if she refuses,  .  Labs have been ordered and abdominal ultrasound  ordered as well   . confirme that she has not been taking Victoza.  f she has ,  Stop it immediately

## 2016-06-16 NOTE — Telephone Encounter (Signed)
Kimberly Brady called saying she knows she's in desperate need of labwork due to having the following symptoms:  Jaundice (yellow eyes), itching, flu-like symptoms, stool light in color, urine odd color, etc.  She said she's had this before and knows her liver enzymes are elevated. She's wondering if she can come in today to have her labs drawn.  Pt's ph# [phone] Thank you.

## 2016-06-16 NOTE — Telephone Encounter (Signed)
Patient has been informed and she is currently not taking any victoza.  Patient stated she is not feeling bad at the moment and will not go to Urgent Care or ED.  Patient did state that if SX become worse she will go to Urgent Care or ED

## 2016-06-16 NOTE — Telephone Encounter (Signed)
Spoken to patient. This has happened numerous times in the past. She would like Dr. Derrel Nip to authorize labs.  Since there are no openings today I instructed patient that if sx become worse please go to an urgent care or ED.  Patient stated she if feeling very well. Please advise.

## 2016-06-17 LAB — HEMOGLOBIN A1C: HEMOGLOBIN A1C: 6.8 % — AB (ref 4.0–6.0)

## 2016-06-18 ENCOUNTER — Encounter: Payer: Self-pay | Admitting: Emergency Medicine

## 2016-06-18 ENCOUNTER — Ambulatory Visit (INDEPENDENT_AMBULATORY_CARE_PROVIDER_SITE_OTHER)
Admission: EM | Admit: 2016-06-18 | Discharge: 2016-06-18 | Disposition: A | Discharge status: Home / Self Care | Payer: 59 | Source: Home / Self Care | Attending: Family Medicine | Admitting: Family Medicine

## 2016-06-18 ENCOUNTER — Encounter: Payer: Self-pay | Admitting: Internal Medicine

## 2016-06-18 ENCOUNTER — Observation Stay
Admission: EM | Admit: 2016-06-18 | Discharge: 2016-06-20 | Disposition: A | Discharge status: Home / Self Care | Payer: 59 | Attending: Internal Medicine | Admitting: Internal Medicine

## 2016-06-18 ENCOUNTER — Emergency Department: Payer: 59

## 2016-06-18 DIAGNOSIS — Z6839 Body mass index (BMI) 39.0-39.9, adult: Secondary | ICD-10-CM | POA: Insufficient documentation

## 2016-06-18 DIAGNOSIS — M5431 Sciatica, right side: Secondary | ICD-10-CM | POA: Diagnosis not present

## 2016-06-18 DIAGNOSIS — R17 Unspecified jaundice: Principal | ICD-10-CM | POA: Insufficient documentation

## 2016-06-18 DIAGNOSIS — Z8619 Personal history of other infectious and parasitic diseases: Secondary | ICD-10-CM | POA: Insufficient documentation

## 2016-06-18 DIAGNOSIS — Y813 Surgical instruments, materials and general- and plastic-surgery devices (including sutures) associated with adverse incidents: Secondary | ICD-10-CM | POA: Insufficient documentation

## 2016-06-18 DIAGNOSIS — Z90710 Acquired absence of both cervix and uterus: Secondary | ICD-10-CM | POA: Diagnosis not present

## 2016-06-18 DIAGNOSIS — D123 Benign neoplasm of transverse colon: Secondary | ICD-10-CM | POA: Diagnosis not present

## 2016-06-18 DIAGNOSIS — Z803 Family history of malignant neoplasm of breast: Secondary | ICD-10-CM | POA: Insufficient documentation

## 2016-06-18 DIAGNOSIS — I10 Essential (primary) hypertension: Secondary | ICD-10-CM | POA: Diagnosis not present

## 2016-06-18 DIAGNOSIS — Z833 Family history of diabetes mellitus: Secondary | ICD-10-CM | POA: Diagnosis not present

## 2016-06-18 DIAGNOSIS — K915 Postcholecystectomy syndrome: Secondary | ICD-10-CM | POA: Insufficient documentation

## 2016-06-18 DIAGNOSIS — D125 Benign neoplasm of sigmoid colon: Secondary | ICD-10-CM | POA: Diagnosis not present

## 2016-06-18 DIAGNOSIS — Z800 Family history of malignant neoplasm of digestive organs: Secondary | ICD-10-CM | POA: Insufficient documentation

## 2016-06-18 DIAGNOSIS — Y838 Other surgical procedures as the cause of abnormal reaction of the patient, or of later complication, without mention of misadventure at the time of the procedure: Secondary | ICD-10-CM | POA: Insufficient documentation

## 2016-06-18 DIAGNOSIS — E669 Obesity, unspecified: Secondary | ICD-10-CM | POA: Insufficient documentation

## 2016-06-18 DIAGNOSIS — E785 Hyperlipidemia, unspecified: Secondary | ICD-10-CM | POA: Diagnosis not present

## 2016-06-18 DIAGNOSIS — Z801 Family history of malignant neoplasm of trachea, bronchus and lung: Secondary | ICD-10-CM | POA: Diagnosis not present

## 2016-06-18 DIAGNOSIS — R748 Abnormal levels of other serum enzymes: Secondary | ICD-10-CM | POA: Insufficient documentation

## 2016-06-18 DIAGNOSIS — Z86010 Personal history of colonic polyps: Secondary | ICD-10-CM | POA: Insufficient documentation

## 2016-06-18 DIAGNOSIS — E119 Type 2 diabetes mellitus without complications: Secondary | ICD-10-CM | POA: Insufficient documentation

## 2016-06-18 DIAGNOSIS — Z87891 Personal history of nicotine dependence: Secondary | ICD-10-CM | POA: Diagnosis not present

## 2016-06-18 DIAGNOSIS — K831 Obstruction of bile duct: Secondary | ICD-10-CM

## 2016-06-18 DIAGNOSIS — E1136 Type 2 diabetes mellitus with diabetic cataract: Secondary | ICD-10-CM | POA: Insufficient documentation

## 2016-06-18 DIAGNOSIS — K839 Disease of biliary tract, unspecified: Secondary | ICD-10-CM | POA: Diagnosis not present

## 2016-06-18 DIAGNOSIS — K759 Inflammatory liver disease, unspecified: Secondary | ICD-10-CM | POA: Diagnosis not present

## 2016-06-18 DIAGNOSIS — K760 Fatty (change of) liver, not elsewhere classified: Secondary | ICD-10-CM | POA: Diagnosis not present

## 2016-06-18 LAB — COMPREHENSIVE METABOLIC PANEL
ALBUMIN: 4.1 g/dL (ref 3.5–5.0)
ALK PHOS: 253 U/L — AB (ref 38–126)
ALT: 548 U/L — ABNORMAL HIGH (ref 14–54)
AST: 237 U/L — AB (ref 15–41)
Anion gap: 8 (ref 5–15)
BILIRUBIN TOTAL: 3.1 mg/dL — AB (ref 0.3–1.2)
BUN: 11 mg/dL (ref 6–20)
CALCIUM: 9.6 mg/dL (ref 8.9–10.3)
CO2: 27 mmol/L (ref 22–32)
CREATININE: 0.6 mg/dL (ref 0.44–1.00)
Chloride: 104 mmol/L (ref 101–111)
GFR calc Af Amer: >60 mL/min (ref >60)
GFR calc non Af Amer: >60 mL/min (ref >60)
GLUCOSE: 152 mg/dL — AB (ref 65–99)
Potassium: 4.3 mmol/L (ref 3.5–5.1)
Sodium: 139 mmol/L (ref 135–145)
TOTAL PROTEIN: 8 g/dL (ref 6.5–8.1)

## 2016-06-18 LAB — CBC
HCT: 44.7 % (ref 35.0–47.0)
Hemoglobin: 15.6 g/dL (ref 12.0–16.0)
MCH: 31.3 pg (ref 26.0–34.0)
MCHC: 34.8 g/dL (ref 32.0–36.0)
MCV: 90 fL (ref 80.0–100.0)
Platelets: 160 10*3/uL (ref 150–440)
RBC: 4.97 MIL/uL (ref 3.80–5.20)
RDW: 13.4 % (ref 11.5–14.5)
WBC: 3.7 10*3/uL (ref 3.6–11.0)

## 2016-06-18 LAB — GLUCOSE, CAPILLARY: Glucose-Capillary: 167 mg/dL — ABNORMAL HIGH (ref 65–99)

## 2016-06-18 LAB — LIPASE, BLOOD: Lipase: 36 U/L (ref 11–51)

## 2016-06-18 LAB — URINALYSIS COMPLETE WITH MICROSCOPIC (ARMC ONLY)
Glucose, UA: NEGATIVE mg/dL
KETONES UR: NEGATIVE mg/dL
Nitrite: NEGATIVE
PH: 5.5 (ref 5.0–8.0)
Specific Gravity, Urine: 1.02 (ref 1.005–1.030)

## 2016-06-18 LAB — PROTIME-INR
INR: 0.85
Prothrombin Time: 11.6 seconds (ref 11.4–15.2)

## 2016-06-18 LAB — BILIRUBIN, DIRECT: BILIRUBIN DIRECT: 1.6 mg/dL — AB (ref 0.1–0.5)

## 2016-06-18 MED ORDER — BISACODYL 5 MG PO TBEC: 5.0000 mg | DELAYED_RELEASE_TABLET | Freq: Every day | ORAL | Status: DC | PRN

## 2016-06-18 MED ORDER — SODIUM CHLORIDE 0.9 % IV BOLUS (SEPSIS)
1000.0000 mL | Freq: Once | INTRAVENOUS | Status: AC
  Administered 2016-06-18: 1000 mL via INTRAVENOUS

## 2016-06-18 MED ORDER — INSULIN ASPART 100 UNIT/ML ~~LOC~~ SOLN: 0.0000 [IU] | Freq: Three times a day (TID) | SUBCUTANEOUS | Status: DC

## 2016-06-18 MED ORDER — ONDANSETRON HCL 4 MG/2ML IJ SOLN: 4.0000 mg | Freq: Four times a day (QID) | INTRAMUSCULAR | Status: DC | PRN

## 2016-06-18 MED ORDER — POLYETHYLENE GLYCOL 3350 17 G PO PACK: 17.0000 g | PACK | Freq: Every day | ORAL | Status: DC | PRN

## 2016-06-18 MED ORDER — ALBUTEROL SULFATE (2.5 MG/3ML) 0.083% IN NEBU: 2.5000 mg | INHALATION_SOLUTION | RESPIRATORY_TRACT | Status: DC | PRN

## 2016-06-18 MED ORDER — SODIUM CHLORIDE 0.9 % IV SOLN
INTRAVENOUS | Status: AC
  Administered 2016-06-18: 22:00:00 via INTRAVENOUS

## 2016-06-18 MED ORDER — ENOXAPARIN SODIUM 40 MG/0.4ML ~~LOC~~ SOLN
40.0000 mg | SUBCUTANEOUS | Status: DC
  Administered 2016-06-18 – 2016-06-19 (×2): 40 mg via SUBCUTANEOUS
  Filled 2016-06-18 (×2): qty 0.4

## 2016-06-18 MED ORDER — ONDANSETRON HCL 4 MG PO TABS: 4.0000 mg | ORAL_TABLET | Freq: Four times a day (QID) | ORAL | Status: DC | PRN

## 2016-06-18 NOTE — H&P (Signed)
[facility] [place] NAME: Kimberly Brady    MR#:  [id number]  DATE OF BIRTH:  October 29, 1950  DATE OF ADMISSION:  06/18/2016  PRIMARY CARE PHYSICIAN: Crecencio Mc, MD   REQUESTING/REFERRING PHYSICIAN: Dr.Veronese  CHIEF COMPLAINT:   Chief Complaint  Patient presents with  . Nausea  . Fever    HISTORY OF PRESENT ILLNESS:  Kimberly Brady  is a 65 y.o. 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 She has also noticed dark urine and light-colored stools which are similar to her last problems with biliary stricture in 2008. Generalized itching. No fever or abdominal pain. She has seen Dr. wall in the past in 2008. Last year she had elevated LFTs which were thought to be due to the toes and stopped by her primary care physician and resolved.  Today patient's right upper quadrant ultrasound shows CBD dilated at 11 mm with possible stricture.  PAST MEDICAL HISTORY:   Past Medical History:  Diagnosis Date  . Diabetes mellitus    type 2  . Hypertension    situational, pt states only at doctors office, never medicated  . Obesity (BMI 30-39.9)     PAST SURGICAL HISTORY:   Past Surgical History:  Procedure Laterality Date  . CHOLECYSTECTOMY  2009   lapaoscopic  . COLONOSCOPY WITH PROPOFOL N/A 06/25/2015   Procedure: COLONOSCOPY WITH PROPOFOL;  Surgeon: Lucilla Lame, MD;  Location: [facility];  Service: Endoscopy;  Laterality: N/A;  Diabetic - diet controlled  . EYE SURGERY  Jan 2013   serial cataract extreaction, intraocular lense  . POLYPECTOMY  06/25/2015   Procedure: POLYPECTOMY;  Surgeon: Lucilla Lame, MD;  Location: [facility];  Service: Endoscopy;;  . SMALL INTESTINE SURGERY  2008   Biliary stent placement,  removal  . TOTAL ABDOMINAL HYSTERECTOMY  W/ BILATERAL SALPINGOOPHORECTOMY  1986   for polycystic ovaries,endometriosis    SOCIAL HISTORY:   Social History  Substance Use Topics  . Smoking status: Never Smoker  . Smokeless tobacco: Never Used  . Alcohol use No    FAMILY HISTORY:   Family History  Problem Relation Age of Onset  . Diabetes Mother     type 2  . Cancer Mother     colon,breast  . Cancer Father     lung  . Breast cancer Sister   . Colon cancer Brother   . Lung cancer Brother     DRUG ALLERGIES:  No Known Allergies  REVIEW OF SYSTEMS:   Review of Systems  Constitutional: Positive for malaise/fatigue. Negative for chills, fever and weight loss.  HENT: Negative for hearing loss and nosebleeds.   Eyes: Negative for blurred vision, double vision and pain.  Respiratory: Negative for cough, hemoptysis, sputum production, shortness of breath and wheezing.   Cardiovascular: Negative for chest pain, palpitations, orthopnea and leg swelling.  Gastrointestinal: Positive for nausea. Negative for abdominal pain, constipation, diarrhea and vomiting.  Genitourinary: Negative for dysuria and hematuria.  Musculoskeletal: Negative for back pain, falls and myalgias.  Skin: Negative for rash.  Neurological: Negative for dizziness, tremors, sensory change, speech change, focal weakness, seizures and headaches.  Endo/Heme/Allergies: Does not bruise/bleed easily.  Psychiatric/Behavioral: Negative for depression and memory loss. The patient is not nervous/anxious.     MEDICATIONS AT HOME:   Prior to Admission  medications   Medication Sig Start Date End Date Taking? Authorizing Provider  TRUE METRIX BLOOD GLUCOSE TEST test strip TEST ONCE DAILY 02/07/16   Crecencio Mc, MD  TRUEPLUS LANCETS 30G MISC TEST ONCE DAILY 02/07/16   Crecencio Mc, MD     VITAL SIGNS:  Blood pressure (!) 157/78, pulse 69, temperature 98.1 F (36.7 C), temperature source Oral, resp. rate 15, height 5\' 2"  (1.575 m), weight 99.8 kg (220 lb),  SpO2 99 %.  PHYSICAL EXAMINATION:  Physical Exam  GENERAL:  65 y.o.-year-old patient lying in the bed with no acute distress.  EYES: Pupils equal, round, reactive to light and accommodation. No scleral icterus. Extraocular muscles intact.  HEENT: Head atraumatic, normocephalic. Oropharynx and nasopharynx clear. No oropharyngeal erythema, moist oral mucosa  NECK:  Supple, no jugular venous distention. No thyroid enlargement, no tenderness.  LUNGS: Normal breath sounds bilaterally, no wheezing, rales, rhonchi. No use of accessory muscles of respiration.  CARDIOVASCULAR: S1, S2 normal. No murmurs, rubs, or gallops.  ABDOMEN: Soft, nontender, nondistended. Bowel sounds present. No organomegaly or mass.  EXTREMITIES: No pedal edema, cyanosis, or clubbing. + 2 pedal & radial pulses b/l.   NEUROLOGIC: Cranial nerves II through XII are intact. No focal Motor or sensory deficits appreciated b/l PSYCHIATRIC: The patient is alert and oriented x 3. Good affect.  SKIN: No obvious rash, lesion, or ulcer.   LABORATORY PANEL:   CBC  Recent Labs Lab 06/18/16 1336  WBC 3.7  HGB 15.6  HCT 44.7  PLT 160   ------------------------------------------------------------------------------------------------------------------  Chemistries   Recent Labs Lab 06/18/16 1336  NA 139  K 4.3  CL 104  CO2 27  GLUCOSE 152*  BUN 11  CREATININE 0.60  CALCIUM 9.6  AST 237*  ALT 548*  ALKPHOS 253*  BILITOT 3.1*   ------------------------------------------------------------------------------------------------------------------  Cardiac Enzymes No results for input(s): TROPONINI in the last 168 hours. ------------------------------------------------------------------------------------------------------------------  RADIOLOGY:  US Abdomen Limited Ruq  Result Date: 06/18/2016 CLINICAL DATA:  Three day history of jaundice. EXAM: US ABDOMEN LIMITED - RIGHT UPPER QUADRANT COMPARISON:  CT scan 07/26/2007.   Ultrasound exam 07/25/2007. FINDINGS: Gallbladder: Surgically absent. Common bile duct: Diameter: Extrahepatic common duct measures 11 mm diameter. Previous CT scan showed evidence of a common bile duct stent in place at that time. Common bile duct in the head of the pancreas on today's study is not seen secondary to overlying bowel gas. Liver: Coarsened echotexture with poor acoustic through transmission, features suggesting steatosis. Mild intrahepatic biliary duct dilatation is suggested. IMPRESSION: 1. Extrahepatic common duct measures up to 11 mm in this patient with previous common bile duct manipulation. The distal common bile duct cannot be visualized on today's study secondary to midline overlying bowel gas. 2. Steatosis. 3. Consider CT or MRI imaging to further evaluate. Electronically Signed   By: Misty Stanley M.D.   On: 06/18/2016 17:12     IMPRESSION AND PLAN:   * Acute hepatitis with possible operative jaundice CBD dilated to 11 mm could be chronic postoperative changes versus acute change. Bilirubin trending down compared to 2 days back which was checked out patient. Afebrile, normal WBC and no right upper quadrant tenderness.  We'll admit patient under observation. Check MRCP. Consult GI. Repeat labs in the morning. Check INR  * Diet-controlled diabetes mellitus Blood sugar is elevated at this time. Sliding scale insulin and diabetic diet. Recent HbA1c was 6.7.  * DVT prophylaxis Lovenox  All the records are reviewed and case discussed with  ED provider. Management plans discussed with the patient, family and they are in agreement.  CODE STATUS: FULL CODE  TOTAL TIME TAKING CARE OF THIS PATIENT: 40 minutes.   Hillary Bow R M.D on 06/18/2016 at 5:40 PM  Between 7am to 6pm - Pager - [phone]  After 6pm go to [email or web address] - password [email or web address] [facility]  Office  [phone]  CC: Primary care physician; Crecencio Mc, MD  Note: This  dictation was prepared with Dragon dictation along with smaller phrase technology. Any transcriptional errors that result from this process are unintentional.

## 2016-06-18 NOTE — ED Triage Notes (Signed)
Pt states she started feeling bad on Thursday and had some nausea on Friday. Pt states she has had fever, dark urine, itching all over, and feels weak. Pt was seen by PCP and had lab work drawn on Friday. Pt had some abnormal results and is following up. Pt states she has hx of elevated liver enzymes.

## 2016-06-18 NOTE — ED Provider Notes (Signed)
[facility] Emergency Department Provider Note  ____________________________________________  Time seen: Approximately 4:17 PM  I have reviewed the triage vital signs and the nursing notes.   HISTORY  Chief Complaint Nausea and Fever   HPI Aimy Stepien is a 65 y.o. female with a history of diabetes, hypertension, and prior obstruction of CBD who presents for evaluation of jaundice. Patient has had multiple ERCPs with multiple stents intially thought to be due to CBD stricture. She is a patient of Dr. Allen Norris. She had her gallbladder finally removed in 2007 and her last stent was removed in 2008. Patient did not have recurrence of her symptoms since having her gallbladder removed until last year. Last year she developed jaundice which was attributed to her viktosa. She was taken off the medicine with resolution of her symptoms. 4 days ago patient noticed jaundice, dark tea color urine and tan color stools. She called her PCP who recommended she come in for evaluation. Patient denies abdominal pain, chills, fever, vomiting, diarrhea. She does have had nausea. She reports she has never had abdominal pain with any of her prior episodes.  Past Medical History:  Diagnosis Date  . Diabetes mellitus    type 2  . Hypertension    situational, pt states only at doctors office, never medicated  . Obesity (BMI 30-39.9)     Patient Active Problem List   Diagnosis Date Noted  . Family history of malignant neoplasm of gastrointestinal tract   . Benign neoplasm of transverse colon   . Benign neoplasm of sigmoid colon   . History of Lyme disease 12/20/2014  . Visit for preventive health examination 12/20/2014  . S/P TAH-BSO (total abdominal hysterectomy and bilateral salpingo-oophorectomy) 12/18/2014  . Colon cancer screening 12/18/2014  . Transaminitis 12/18/2014  . Statin intolerance 09/14/2013  . Bunion of great toe of left foot 09/12/2013  . Obesity (BMI  30-39.9) 11/10/2012  . Cataract due to secondary diabetes ([place]) 12/29/2011  . Hyperlipidemia with target LDL less than 70 09/24/2011  . Hypertension 06/14/2011  . Diabetes mellitus type 2 in obese ([place]) 06/14/2011    Past Surgical History:  Procedure Laterality Date  . CHOLECYSTECTOMY  2009   lapaoscopic  . COLONOSCOPY WITH PROPOFOL N/A 06/25/2015   Procedure: COLONOSCOPY WITH PROPOFOL;  Surgeon: Lucilla Lame, MD;  Location: [facility];  Service: Endoscopy;  Laterality: N/A;  Diabetic - diet controlled  . EYE SURGERY  Jan 2013   serial cataract extreaction, intraocular lense  . POLYPECTOMY  06/25/2015   Procedure: POLYPECTOMY;  Surgeon: Lucilla Lame, MD;  Location: [facility] [place];  Service: Endoscopy;;  . SMALL INTESTINE SURGERY  2008   Biliary stent placement,  removal  . TOTAL ABDOMINAL HYSTERECTOMY W/ BILATERAL SALPINGOOPHORECTOMY  1986   for polycystic ovaries,endometriosis    Prior to Admission medications   Medication Sig Start Date End Date Taking? Authorizing Provider  TRUE METRIX BLOOD GLUCOSE TEST test strip TEST ONCE DAILY 02/07/16   Crecencio Mc, MD  TRUEPLUS LANCETS 30G MISC TEST ONCE DAILY 02/07/16   Crecencio Mc, MD    Allergies Review of patient's allergies indicates no known allergies.  Family History  Problem Relation Age of Onset  . Diabetes Mother     type 2  . Cancer Mother     colon,breast  . Cancer Father     lung  . Breast cancer Sister   . Colon cancer Brother   . Lung cancer Brother  Social History Social History  Substance Use Topics  . Smoking status: Never Smoker  . Smokeless tobacco: Never Used  . Alcohol use No    Review of Systems  Constitutional: Negative for fever. + fatigue Eyes: Negative for visual changes. ENT: Negative for sore throat. Cardiovascular: Negative for chest pain. Respiratory: Negative for shortness of breath. Gastrointestinal: Negative for abdominal pain, vomiting or diarrhea. +  nausea Genitourinary: Negative for dysuria. Musculoskeletal: Negative for back pain. Skin: Negative for rash. + jaundice Neurological: Negative for headaches, weakness or numbness.  ____________________________________________   PHYSICAL EXAM:  VITAL SIGNS: ED Triage Vitals  Enc Vitals Group     BP 06/18/16 1322 (!) 173/79     Pulse Rate 06/18/16 1322 72     Resp 06/18/16 1322 17     Temp 06/18/16 1322 98.1 F (36.7 C)     Temp Source 06/18/16 1322 Oral     SpO2 06/18/16 1322 97 %     Weight 06/18/16 1323 220 lb (99.8 kg)     Height 06/18/16 1323 5\' 2"  (1.575 m)     Head Circumference --      Peak Flow --      Pain Score 06/18/16 1346 0     Pain Loc --      Pain Edu? --      Excl. in [place]? --     Constitutional: Alert and oriented. Well appearing and in no apparent distress. HEENT:      Head: Normocephalic and atraumatic.         Eyes: Conjunctivae are normal. Sclera is icteric. EOMI. PERRL      Mouth/Throat: Mucous membranes are moist.       Neck: Supple with no signs of meningismus. Cardiovascular: Regular rate and rhythm. No murmurs, gallops, or rubs. 2+ symmetrical distal pulses are present in all extremities. No JVD. Respiratory: Normal respiratory effort. Lungs are clear to auscultation bilaterally. No wheezes, crackles, or rhonchi.  Gastrointestinal: Soft, non tender, and non distended with positive bowel sounds. No rebound or guarding. Genitourinary: No CVA tenderness. Musculoskeletal: Nontender with normal range of motion in all extremities. No edema, cyanosis, or erythema of extremities. Neurologic: Normal speech and language. Face is symmetric. Moving all extremities. No gross focal neurologic deficits are appreciated. Skin: Jaundice Psychiatric: Mood and affect are normal. Speech and behavior are normal.  ____________________________________________   LABS (all labs ordered are listed, but only abnormal results are displayed)  Labs Reviewed  COMPREHENSIVE  METABOLIC PANEL - Abnormal; Notable for the following:       Result Value   Glucose, Bld 152 (*)    AST 237 (*)    ALT 548 (*)    Alkaline Phosphatase 253 (*)    Total Bilirubin 3.1 (*)    All other components within normal limits  BILIRUBIN, DIRECT - Abnormal; Notable for the following:    Bilirubin, Direct 1.6 (*)    All other components within normal limits  LIPASE, BLOOD  CBC  URINALYSIS COMPLETEWITH MICROSCOPIC ([facility] ONLY)   ____________________________________________  EKG  none ____________________________________________  RADIOLOGY  RUQ [place]: Extrahepatic common duct measures up to 11 mm in this patient with previous common bile duct manipulation. The distal common bile duct cannot be visualized on today's study secondary to midline overlying bowel gas. 2. Steatosis. ____________________________________________   PROCEDURES  Procedure(s) performed: None Procedures Critical Care performed:  None ____________________________________________   INITIAL IMPRESSION / ASSESSMENT AND PLAN / ED COURSE   65 y.o. female with a  history of diabetes, hypertension, and prior obstruction of CBD who presents for evaluation of jaundice. Patient's last stent was removed in 2008, s/p cholecystectomy in 2007. She has no abdominal pain, no fever, normal white count, normal vital signs. She is followed by Dr. Allen Norris who will be here on call tomorrow. No evidence of ascending infection. Patient will require ERCP to rule out retained stone versus stricture. RUQ [place] ordered. Discussed with Hospitalist who is comfortable accepting patient here for admission.   Clinical Course   _________________________ 5:24 PM on 06/18/2016 -----------------------------------------  Ultrasound showing CBD measuring 11 mm. Patient remains well appearing with no evidence of ascending infection. Will admit to Hospitalist.   Pertinent labs & imaging results that were available during my care of the patient  were reviewed by me and considered in my medical decision making (see chart for details).    ____________________________________________   FINAL CLINICAL IMPRESSION(S) / ED DIAGNOSES  Final diagnoses:  Jaundice  Common bile duct (CBD) obstruction      NEW MEDICATIONS STARTED DURING THIS VISIT:  New Prescriptions   No medications on file     Note:  This document was prepared using Dragon voice recognition software and may include unintentional dictation errors.    Rudene Re, MD 06/18/16 1729

## 2016-06-18 NOTE — ED Notes (Signed)
Pt. Reports hx before with jaundice due to gall bladder which was removed 10 years ago. Pt. Noticed sx reocurring and PCP advised to be scanned for possible bile blockage.

## 2016-06-18 NOTE — ED Notes (Addendum)
Drink provided to family per request. Pt. Informed no ice chips until results are back and MD approval.

## 2016-06-18 NOTE — ED Notes (Signed)
Pt. Provided [place] sandwich tray and diet sprite per request per admitting diet order.

## 2016-06-18 NOTE — ED Provider Notes (Addendum)
[facility]    CSN: [id number] Arrival date & time: 06/18/16  [id number]  First Provider Contact:  First MD Initiated Contact with Patient 06/18/16 [phone]        History   Chief Complaint Chief Complaint  Patient presents with  . Nausea  . Generalized Body Aches    HPI Kimberly Brady is a 65 y.o. female.   She is here because of jaundice. She states that Thursday she started becoming jaundice. 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 She reports that she's had her gallbladder out several years ago he brought up requiring stents put in she's had multiple ERCPs as well. Dr.Wahl  the gastroenterologist to care of her before and did distended. She states that he left the area but he is back now of course. She reports last year having an episode of jaundice and Dr. Pearson Forster her PCP stopped the Pitocin and things after we continue went back to normal. She was not feeling well Thursday she called her office Friday and Dr. Pearson Forster orders some labs for her and ordered an ultrasound of her gallbladder upper abdomen to be done on Monday. She states the orders in but the schedule has not been done. She had nausea on Friday felt low but better Saturday started feeling bad again today so she came in here the urgent care to be seen. According to the notes they recommend urgent care the ED she states take clearly told her the urgent care. She states she is I'm sure why they told her to come to the urgent care or she's not sure what we could do for her. She was not able to get her labs information so we were able to give her those.     His had a history of type 2 diabetes, hypertension and obesity. There is a family medical history of diabetes colon cancer mother lung cancer father. She's had C-sections cholecystectomy and multiple bowel duct liver procedures in the past.   The history is provided by the patient. No language interpreter was used.    Past Medical History:    Diagnosis Date  . Diabetes mellitus    type 2  . Hypertension    situational, pt states only at doctors office, never medicated  . Obesity (BMI 30-39.9)     Patient Active Problem List   Diagnosis Date Noted  . Family history of malignant neoplasm of gastrointestinal tract   . Benign neoplasm of transverse colon   . Benign neoplasm of sigmoid colon   . History of Lyme disease 12/20/2014  . Visit for preventive health examination 12/20/2014  . S/P TAH-BSO (total abdominal hysterectomy and bilateral salpingo-oophorectomy) 12/18/2014  . Colon cancer screening 12/18/2014  . Transaminitis 12/18/2014  . Statin intolerance 09/14/2013  . Bunion of great toe of left foot 09/12/2013  . Obesity (BMI 30-39.9) 11/10/2012  . Cataract due to secondary diabetes ([place]) 12/29/2011  . Hyperlipidemia with target LDL less than 70 09/24/2011  . Hypertension 06/14/2011  . Diabetes mellitus type 2 in obese ([place]) 06/14/2011    Past Surgical History:  Procedure Laterality Date  . CHOLECYSTECTOMY  2009   lapaoscopic  . COLONOSCOPY WITH PROPOFOL N/A 06/25/2015   Procedure: COLONOSCOPY WITH PROPOFOL;  Surgeon: Lucilla Lame, MD;  Location: [facility];  Service: Endoscopy;  Laterality: N/A;  Diabetic - diet controlled  . EYE SURGERY  Jan 2013   serial cataract extreaction, intraocular lense  . POLYPECTOMY  06/25/2015   Procedure: POLYPECTOMY;  Surgeon: Lucilla Lame, MD;  Location: [facility];  Service: Endoscopy;;  . SMALL INTESTINE SURGERY  2008   Biliary stent placement,  removal  . TOTAL ABDOMINAL HYSTERECTOMY W/ BILATERAL SALPINGOOPHORECTOMY  1986   for polycystic ovaries,endometriosis    OB History    No data available       Home Medications    Prior to Admission medications   Medication Sig Start Date End Date Taking? Authorizing Provider  TRUE METRIX BLOOD GLUCOSE TEST test strip TEST ONCE DAILY 02/07/16   Crecencio Mc, MD  TRUEPLUS LANCETS 30G MISC TEST ONCE DAILY 02/07/16    Crecencio Mc, MD    Family History Family History  Problem Relation Age of Onset  . Diabetes Mother     type 2  . Cancer Mother     colon,breast  . Cancer Father     lung  . Breast cancer Sister   . Colon cancer Brother   . Lung cancer Brother     Social History Social History  Substance Use Topics  . Smoking status: Former Research scientist (life sciences)  . Smokeless tobacco: Never Used  . Alcohol use No     Allergies   Review of patient's allergies indicates no known allergies.   Review of Systems Review of Systems  Constitutional: Positive for appetite change and fatigue.  Gastrointestinal: Positive for nausea.  All other systems reviewed and are negative.    Physical Exam Triage Vital Signs ED Triage Vitals  Enc Vitals Group     BP 06/18/16 0833 (!) 161/78     Pulse Rate 06/18/16 0833 73     Resp 06/18/16 0833 16     Temp 06/18/16 0833 98.4 F (36.9 C)     Temp Source 06/18/16 0833 Tympanic     SpO2 06/18/16 0833 96 %     Weight 06/18/16 0835 220 lb (99.8 kg)     Height 06/18/16 0835 5\' 2"  (1.575 m)     Head Circumference --      Peak Flow --      Pain Score 06/18/16 0837 4     Pain Loc --      Pain Edu? --      Excl. in [place]? --    No data found.   Updated Vital Signs BP (!) 161/78 (BP Location: Right Arm)   Pulse 73   Temp 98.4 F (36.9 C) (Tympanic)   Resp 16   Ht 5\' 2"  (1.575 m)   Wt 220 lb (99.8 kg)   SpO2 96%   BMI 40.24 kg/m   Visual Acuity Right Eye Distance:   Left Eye Distance:   Bilateral Distance:    Right Eye Near:   Left Eye Near:    Bilateral Near:     Physical Exam  Constitutional: She is oriented to person, place, and time. She appears well-developed and well-nourished.  HENT:  Head: Normocephalic and atraumatic.  Eyes: Pupils are equal, round, and reactive to light.  Neck: Normal range of motion.  Cardiovascular: Normal rate and regular rhythm.   Pulmonary/Chest: Effort normal.  Abdominal: Soft. Bowel sounds are normal. She  exhibits no distension. There is no tenderness.  Musculoskeletal: Normal range of motion. She exhibits no edema, tenderness or deformity.  Neurological: She is alert and oriented to person, place, and time.  Skin: Skin is warm. No erythema.  Psychiatric: She has a normal mood and affect.  Vitals reviewed.    UC Treatments / Results  Labs (all labs  ordered are listed, but only abnormal results are displayed) Labs Reviewed  URINALYSIS COMPLETEWITH MICROSCOPIC ([place]) - Abnormal; Notable for the following:       Result Value   Color, Urine AMBER (*)    Bilirubin Urine LARGE (*)    Hgb urine dipstick TRACE (*)    Protein, ur TRACE (*)    Leukocytes, UA SMALL (*)    Bacteria, UA RARE (*)    Squamous Epithelial / LPF 6-30 (*)    All other components within normal limits  URINE CULTURE    EKG  EKG Interpretation None       Radiology No results found.  Procedures Procedures (including critical care time)  Medications Ordered in UC Medications - No data to display   Initial Impression / Assessment and Plan / UC Course  I have reviewed the triage vital signs and the nursing notes.  Pertinent labs & imaging results that were available during my care of the patient were reviewed by me and considered in my medical decision making (see chart for details).  Clinical Course  I have recommended for the patient go to the emergency room to have CT scans or other imaging modality done and they could try to contact the gastrologist on call if she needs to be admitted. Bilirubin was up and some liver enzymes elevated amylase lipase normal. She has declined  I've offered her nausea medicine she declines now. I recommended that she not eating anything after midnight and offered her work note so that she can be free to get the ultrasound done tomorrow which is [facility]'s ordered not scheduled and she declined that as well. Urine did have bowel present lab work showed a bilirubin greater  than 4 consistent with her history of jaundice liver enzymes were also elevated. She's on follow-up with Dr. Robyne Peers office and Dr. Winnifred Friar office on Monday.    Final Clinical Impressions(s) / UC Diagnoses   Final diagnoses:  Bile duct disease  Jaundice, recurrent    New Prescriptions New Prescriptions   No medications on file       Note: This dictation was prepared with Dragon dictation along with smaller phrase technology. Any transcriptional errors that result from this process are unintentional.   Frederich Cha, MD 06/18/16 [place], MD 06/18/16 [phone]

## 2016-06-18 NOTE — ED Triage Notes (Signed)
Patient c/o nausea, dark urine, body aches, and some cramps when urinating.  Patient states that her PCP had labs drawn on Friday and is in the process of scheduling her an ultrasound.

## 2016-06-19 ENCOUNTER — Observation Stay: Payer: 59

## 2016-06-19 ENCOUNTER — Observation Stay: Payer: 59 | Admitting: Anesthesiology

## 2016-06-19 ENCOUNTER — Telehealth: Payer: Self-pay | Admitting: Internal Medicine

## 2016-06-19 ENCOUNTER — Encounter
Admission: EM | Disposition: A | Discharge status: Home / Self Care | Payer: Self-pay | Source: Home / Self Care | Attending: Emergency Medicine

## 2016-06-19 DIAGNOSIS — K831 Obstruction of bile duct: Secondary | ICD-10-CM

## 2016-06-19 DIAGNOSIS — R748 Abnormal levels of other serum enzymes: Secondary | ICD-10-CM

## 2016-06-19 DIAGNOSIS — E1136 Type 2 diabetes mellitus with diabetic cataract: Secondary | ICD-10-CM | POA: Diagnosis not present

## 2016-06-19 DIAGNOSIS — Z87891 Personal history of nicotine dependence: Secondary | ICD-10-CM | POA: Diagnosis not present

## 2016-06-19 DIAGNOSIS — K759 Inflammatory liver disease, unspecified: Secondary | ICD-10-CM | POA: Diagnosis not present

## 2016-06-19 DIAGNOSIS — Z803 Family history of malignant neoplasm of breast: Secondary | ICD-10-CM | POA: Diagnosis not present

## 2016-06-19 DIAGNOSIS — K8689 Other specified diseases of pancreas: Secondary | ICD-10-CM | POA: Diagnosis not present

## 2016-06-19 DIAGNOSIS — Z833 Family history of diabetes mellitus: Secondary | ICD-10-CM | POA: Diagnosis not present

## 2016-06-19 DIAGNOSIS — E119 Type 2 diabetes mellitus without complications: Secondary | ICD-10-CM | POA: Diagnosis not present

## 2016-06-19 DIAGNOSIS — Z800 Family history of malignant neoplasm of digestive organs: Secondary | ICD-10-CM | POA: Diagnosis not present

## 2016-06-19 DIAGNOSIS — Z801 Family history of malignant neoplasm of trachea, bronchus and lung: Secondary | ICD-10-CM | POA: Diagnosis not present

## 2016-06-19 DIAGNOSIS — R17 Unspecified jaundice: Secondary | ICD-10-CM

## 2016-06-19 DIAGNOSIS — Z8619 Personal history of other infectious and parasitic diseases: Secondary | ICD-10-CM | POA: Diagnosis not present

## 2016-06-19 DIAGNOSIS — K760 Fatty (change of) liver, not elsewhere classified: Secondary | ICD-10-CM | POA: Diagnosis not present

## 2016-06-19 HISTORY — PX: ERCP: SHX5425

## 2016-06-19 LAB — COMPREHENSIVE METABOLIC PANEL
ALT: 448 U/L — AB (ref 14–54)
AST: 215 U/L — AB (ref 15–41)
Albumin: 3.4 g/dL — ABNORMAL LOW (ref 3.5–5.0)
Alkaline Phosphatase: 212 U/L — ABNORMAL HIGH (ref 38–126)
Anion gap: 4 — ABNORMAL LOW (ref 5–15)
BILIRUBIN TOTAL: 4.3 mg/dL — AB (ref 0.3–1.2)
BUN: 8 mg/dL (ref 6–20)
CHLORIDE: 106 mmol/L (ref 101–111)
CO2: 27 mmol/L (ref 22–32)
Calcium: 8.5 mg/dL — ABNORMAL LOW (ref 8.9–10.3)
Creatinine, Ser: 0.54 mg/dL (ref 0.44–1.00)
GFR calc Af Amer: >60 mL/min (ref >60)
GFR calc non Af Amer: >60 mL/min (ref >60)
GLUCOSE: 173 mg/dL — AB (ref 65–99)
POTASSIUM: 4.2 mmol/L (ref 3.5–5.1)
Sodium: 137 mmol/L (ref 135–145)
TOTAL PROTEIN: 6.5 g/dL (ref 6.5–8.1)

## 2016-06-19 LAB — GLUCOSE, CAPILLARY
Glucose-Capillary: 163 mg/dL — ABNORMAL HIGH (ref 65–99)
Glucose-Capillary: 140 mg/dL — ABNORMAL HIGH (ref 65–99)
Glucose-Capillary: 187 mg/dL — ABNORMAL HIGH (ref 65–99)

## 2016-06-19 SURGERY — ERCP, WITH INTERVENTION IF INDICATED
Anesthesia: General

## 2016-06-19 SURGERY — ERCP, WITH INTERVENTION IF INDICATED
Anesthesia: Monitor Anesthesia Care

## 2016-06-19 MED ORDER — SODIUM CHLORIDE 0.9 % IV SOLN
INTRAVENOUS | Status: AC
  Administered 2016-06-19 (×2): via INTRAVENOUS

## 2016-06-19 MED ORDER — LIDOCAINE 2% (20 MG/ML) 5 ML SYRINGE
INTRAMUSCULAR | Status: DC | PRN
  Administered 2016-06-19: 100 mg via INTRAVENOUS

## 2016-06-19 MED ORDER — MIDAZOLAM HCL 2 MG/2ML IJ SOLN
INTRAMUSCULAR | Status: DC | PRN
  Administered 2016-06-19: 2 mg via INTRAVENOUS

## 2016-06-19 MED ORDER — PROPOFOL 10 MG/ML IV BOLUS
INTRAVENOUS | Status: DC | PRN
  Administered 2016-06-19 (×2): 50 mg via INTRAVENOUS

## 2016-06-19 MED ORDER — ONDANSETRON HCL 4 MG/2ML IJ SOLN
INTRAMUSCULAR | Status: DC | PRN
  Administered 2016-06-19: 4 mg via INTRAVENOUS

## 2016-06-19 MED ORDER — EPHEDRINE SULFATE 50 MG/ML IJ SOLN
INTRAMUSCULAR | Status: DC | PRN
  Administered 2016-06-19: 10 mg via INTRAVENOUS

## 2016-06-19 MED ORDER — PROPOFOL 500 MG/50ML IV EMUL
INTRAVENOUS | Status: DC | PRN
  Administered 2016-06-19: 200 ug/kg/min via INTRAVENOUS

## 2016-06-19 MED ORDER — GLYCOPYRROLATE 0.2 MG/ML IJ SOLN
INTRAMUSCULAR | Status: DC | PRN
  Administered 2016-06-19: 0.2 mg via INTRAVENOUS

## 2016-06-19 MED ORDER — GADOBENATE DIMEGLUMINE 529 MG/ML IV SOLN
20.0000 mL | Freq: Once | INTRAVENOUS | Status: AC | PRN
  Administered 2016-06-19: 12:00:00 20 mL via INTRAVENOUS

## 2016-06-19 NOTE — Telephone Encounter (Signed)
It is the [place] office in [place]. She can call to schedule this appointment. Her order is in her chart. I will send her a message

## 2016-06-19 NOTE — Telephone Encounter (Signed)
Pt sent a Mychart wanting to get a mammo done at [facility] location Thank you!

## 2016-06-19 NOTE — Op Note (Signed)
[facility] Patient Name: Kimberly Brady Procedure Date: 06/19/2016 4:36 PM MRN: [id number] Account #: [id number] Date of Birth: 03/29/1951 Admit Type: Inpatient Age: 65 Room: [facility] ENDO ROOM 4 Gender: Female Note Status: Finalized Procedure:            ERCP Indications:          Biliary dilation on Computed Tomogram Scan, Jaundice,                        Elevated liver enzymes Providers:            Lucilla Lame MD, MD Referring MD:         Deborra Medina, MD (Referring MD) Medicines:            Propofol per Anesthesia Complications:        No immediate complications. Procedure:            Pre-Anesthesia Assessment:                       - Prior to the procedure, a History and Physical was                        performed, and patient medications and allergies were                        reviewed. The patient's tolerance of previous                        anesthesia was also reviewed. The risks and benefits of                        the procedure and the sedation options and risks were                        discussed with the patient. All questions were                        answered, and informed consent was obtained. Prior                        Anticoagulants: The patient has taken no previous                        anticoagulant or antiplatelet agents. ASA Grade                        Assessment: II - A patient with mild systemic disease.                        After reviewing the risks and benefits, the patient was                        deemed in satisfactory condition to undergo the                        procedure.                       After obtaining informed consent, the scope was passed  under direct vision. Throughout the procedure, the                        patient's blood pressure, pulse, and oxygen saturations                        were monitored continuously. The ERCP was introduced   through the mouth, and used to inject contrast into and                        used to cannulate the bile duct. The ERCP was                        accomplished without difficulty. The patient tolerated                        the procedure well. Findings:      A scout film of the abdomen was obtained. The bile duct was deeply       cannulated. Contrast was injected. I personally interpreted the bile       duct images. There was brisk flow of contrast through the ducts. Image       quality was excellent. Contrast extended to the hepatic ducts. The       entire biliary tree was diffusely dilated. The common hepatic duct       contained a single segmental stenosis. A wire was passed into the       biliary tree. Dilation of the common bile duct with a 6 mm balloon       dilator was successful. One 10 Fr by 9 cm plastic stent was placed into       the common bile duct. Bile flowed through the stent. The stent was in       good position. One 10 Fr by 7 cm plastic stent was placed into the       common bile duct. Bile flowed through the stent. The stent was in good       position. 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                       - The entire biliary tree was dilated.                       - Common bile duct was successfully dilated.                       - One plastic stent was placed into the common bile                        duct.                       - One plastic stent was placed into the common bile                        duct. Recommendation:       - Watch for pancreatitis, bleeding, perforation, and  cholangitis.                       - Repeat ERCP in 3 months to remove stent. Procedure Code(s):    --- Professional ---                       [phone], Endoscopic retrograde cholangiopancreatography                        (ERCP); with placement of endoscopic stent into biliary                         or pancreatic duct, including pre- and post-dilation                        and guide wire passage, when performed, including                        sphincterotomy, when performed, each stent                       [id number], 47, Endoscopic retrograde                        cholangiopancreatography (ERCP); with placement of                        endoscopic stent into biliary or pancreatic duct,                        including pre- and post-dilation and guide wire                        passage, when performed, including sphincterotomy, when                        performed, each stent                       [id number], Endoscopic catheterization of the biliary ductal                        system, radiological supervision and interpretation Diagnosis Code(s):    --- Professional ---                       R74.8, Abnormal levels of other serum enzymes                       R17, Unspecified jaundice                       K91.89, Other postprocedural complications and                        disorders of digestive system                       K83.1, Obstruction of bile duct                       K83.8, Other specified diseases of biliary tract CPT copyright 2016 American Medical Association. All rights reserved. The codes documented in this report are  preliminary and upon coder review may  be revised to meet current compliance requirements. Lucilla Lame MD, MD 06/19/2016 5:17:34 PM This report has been signed electronically. Number of Addenda: 0 Note Initiated On: 06/19/2016 4:36 PM      [facility]

## 2016-06-19 NOTE — Telephone Encounter (Signed)
Which place is in [place]? Thanks

## 2016-06-19 NOTE — Progress Notes (Signed)
[place] [facility] [place] NAME: Kimberly Brady    MR#:  [id number]  DATE OF BIRTH:  05/16/1951  SUBJECTIVE:  CHIEF COMPLAINT:   Chief Complaint  Patient presents with  . Nausea  . Fever   - Admitted with jaundice and elevated liver function tests as outpatient. Prior history of biliary stent placement and removal. -Ultrasound showing CBD dilation. Patient is status post cholecystectomy. -MRCP and GI consult pending  REVIEW OF SYSTEMS:  Review of Systems  Constitutional: Positive for malaise/fatigue. Negative for chills and fever.  HENT: Negative for ear discharge, ear pain, nosebleeds and tinnitus.   Eyes: Negative for blurred vision and double vision.  Respiratory: Negative for cough, shortness of breath and wheezing.   Cardiovascular: Negative for chest pain, palpitations and leg swelling.  Gastrointestinal: Negative for abdominal pain, constipation, diarrhea, nausea and vomiting.  Genitourinary: Negative for dysuria.  Musculoskeletal: Negative for myalgias.  Neurological: Negative for dizziness, sensory change, speech change, focal weakness, seizures and headaches.  Psychiatric/Behavioral: Negative for depression.    DRUG ALLERGIES:  No Known Allergies  VITALS:  Blood pressure (!) 123/59, pulse (!) 52, temperature 98.1 F (36.7 C), temperature source Oral, resp. rate (!) 24, height 5\' 2"  (1.575 m), weight 99.3 kg (219 lb), SpO2 97 %.  PHYSICAL EXAMINATION:  Physical Exam  GENERAL:  65 y.o.-year-old patient lying in the bed with no acute distress.  EYES: Pupils equal, round, reactive to light and accommodation. mild scleral icterus. Extraocular muscles intact.  HEENT: Head atraumatic, normocephalic. Oropharynx and nasopharynx clear.  NECK:  Supple, no jugular venous distention. No thyroid enlargement, no tenderness.  LUNGS: Normal breath sounds bilaterally, no wheezing, rales,rhonchi or crepitation. No use of accessory muscles of  respiration.  CARDIOVASCULAR: S1, S2 normal. No murmurs, rubs, or gallops.  ABDOMEN: Soft, nontender, nondistended. Bowel sounds present. No organomegaly or mass.  EXTREMITIES: No pedal edema, cyanosis, or clubbing.  NEUROLOGIC: Cranial nerves II through XII are intact. Muscle strength 5/5 in all extremities. Sensation intact. Gait not checked.  PSYCHIATRIC: The patient is alert and oriented x 3.  SKIN: No obvious rash, lesion, or ulcer.    LABORATORY PANEL:   CBC  Recent Labs Lab 06/18/16 1336  WBC 3.7  HGB 15.6  HCT 44.7  PLT 160   ------------------------------------------------------------------------------------------------------------------  Chemistries   Recent Labs Lab 06/19/16 0412  NA 137  K 4.2  CL 106  CO2 27  GLUCOSE 173*  BUN 8  CREATININE 0.54  CALCIUM 8.5*  AST 215*  ALT 448*  ALKPHOS 212*  BILITOT 4.3*   ------------------------------------------------------------------------------------------------------------------  Cardiac Enzymes No results for input(s): TROPONINI in the last 168 hours. ------------------------------------------------------------------------------------------------------------------  RADIOLOGY:  Mr Abd W/wo Cm/mrcp  Result Date: 06/19/2016 CLINICAL DATA:  Abdominal pain and nausea with dark urine and light color stool for several days. History of cholecystectomy, biliary stricture with stenting and diabetes. Elevated serum transaminase, alkaline phosphatase and total bilirubin levels. EXAM: MRI ABDOMEN WITHOUT AND WITH CONTRAST (INCLUDING MRCP) TECHNIQUE: Multiplanar multisequence MR imaging of the abdomen was performed both before and after the administration of intravenous contrast. Heavily T2-weighted images of the biliary and pancreatic ducts were obtained, and three-dimensional MRCP images were rendered by post processing. CONTRAST:  34mL MULTIHANCE GADOBENATE DIMEGLUMINE 529 MG/ML IV SOLN COMPARISON:  Ultrasound 06/18/2016,  CT 07/26/2007 and MRCP 06/07/2006. FINDINGS: Lower chest:  Unremarkable. Hepatobiliary: The liver demonstrates mild heterogeneous steatosis with loss of signal on the opposed phase gradient echo images. There are  no focal hepatic lesions. There is no abdominal hepatic enhancement following contrast. Status post cholecystectomy. Biliary stent demonstrated on prior CT is no longer visualized. There is chronic intrahepatic biliary dilatation with dilatation of the left intrahepatic ducts to 10 mm. There is smooth focal narrowing of the common hepatic duct. The common bile duct is normal in caliber. Small cystic duct remnant is noted. No intraductal filling defects or abnormal biliary wall enhancement seen. Pancreas: Unremarkable. There is no evidence of pancreatic mass, surrounding inflammation or fluid collection. There is no pancreatic ductal dilatation. No evidence of pancreas divisum. Spleen: Normal in size without focal abnormality. Adrenals/Urinary Tract: Both adrenal glands appear normal. The kidneys appear normal without evidence of urinary tract calculus or hydronephrosis. Bladder not imaged. Stomach/Bowel: No evidence of bowel wall thickening, distention or surrounding inflammatory change. Vascular/Lymphatic: There are no enlarged abdominal lymph nodes. No significant vascular findings are present. Other: None. Musculoskeletal: No acute or significant osseous findings. Mild scoliosis. IMPRESSION: 1. Intrahepatic biliary dilatation, asymmetrically involving the left hepatic lobe. Overall appearance is similar to MRCP from 2007 and presumably secondary to benign biliary stricture given chronicity. No definite superimposed choledocholithiasis. If the patient has persistent unexplained elevated liver function studies, ERCP may be warranted. 2. The pancreas and pancreatic duct appear normal. 3. Mild hepatic steatosis.  No focal hepatic lesions. Electronically Signed   By: Richardean Sale M.D.   On: 06/19/2016  12:41   US Abdomen Limited Ruq  Result Date: 06/18/2016 CLINICAL DATA:  Three day history of jaundice. EXAM: US ABDOMEN LIMITED - RIGHT UPPER QUADRANT COMPARISON:  CT scan 07/26/2007.  Ultrasound exam 07/25/2007. FINDINGS: Gallbladder: Surgically absent. Common bile duct: Diameter: Extrahepatic common duct measures 11 mm diameter. Previous CT scan showed evidence of a common bile duct stent in place at that time. Common bile duct in the head of the pancreas on today's study is not seen secondary to overlying bowel gas. Liver: Coarsened echotexture with poor acoustic through transmission, features suggesting steatosis. Mild intrahepatic biliary duct dilatation is suggested. IMPRESSION: 1. Extrahepatic common duct measures up to 11 mm in this patient with previous common bile duct manipulation. The distal common bile duct cannot be visualized on today's study secondary to midline overlying bowel gas. 2. Steatosis. 3. Consider CT or MRI imaging to further evaluate. Electronically Signed   By: Misty Stanley M.D.   On: 06/18/2016 17:12    EKG:   Orders placed or performed in visit on 07/23/07  . EKG 12-Lead    ASSESSMENT AND PLAN:   64y/o F with PMH of DM, h/o biliary stricture s/p stent and removal about 10 years ago, admitted with elevated LFts and jaundice  #1 Elevated LFTs and obstructive jaundice- [place] with CBD dilation, MRCP with benign stricture - might need ERCP -GI consulted, NPO  #2 DM- SSI, a1c of 6.7  #3 DVT prophylaxis- lovenox    All the records are reviewed and case discussed with Care Management/Social Workerr. Management plans discussed with the patient, family and they are in agreement.  CODE STATUS: Full code  TOTAL TIME TAKING CARE OF THIS PATIENT: 37 minutes.   POSSIBLE D/C IN 1-2 DAYS, DEPENDING ON CLINICAL CONDITION.   Jahel Wavra M.D on 06/19/2016 at 1:09 PM  Between 7am to 6pm - Pager - [phone]  After 6pm go to [email or web address] - password [email or web address]  [facility]  Office  [phone]  CC: Primary care physician; Crecencio Mc, MD

## 2016-06-19 NOTE — Transfer of Care (Signed)
Immediate Anesthesia Transfer of Care Note  Patient: Kimberly Brady  Procedure(s) Performed: Procedure(s): ENDOSCOPIC RETROGRADE CHOLANGIOPANCREATOGRAPHY (ERCP) (N/A)  Patient Location: Endoscopy Unit  Anesthesia Type:General  Level of Consciousness: awake and alert   Airway & Oxygen Therapy: Patient connected to nasal cannula oxygen  Post-op Assessment: Post -op Vital signs reviewed and stable  Post vital signs: stable  Last Vitals:  Vitals:   06/19/16 1604 06/19/16 1725  BP: (!) 163/71 107/69  Pulse: 64 80  Resp:  15  Temp: 36.6 C     Last Pain:  Vitals:   06/19/16 1604  TempSrc: Tympanic  PainSc:          Complications: No apparent anesthesia complications

## 2016-06-19 NOTE — Anesthesia Preprocedure Evaluation (Signed)
Anesthesia Evaluation  Patient identified by MRN, date of birth, ID band Patient awake    Reviewed: Allergy & Precautions, NPO status , Patient's Chart, lab work & pertinent test results  History of Anesthesia Complications Negative for: history of anesthetic complications  Airway Mallampati: II  TM Distance: >3 FB Neck ROM: Full    Dental no notable dental hx.    Pulmonary neg pulmonary ROS, neg sleep apnea, neg COPD,    breath sounds clear to auscultation- rhonchi (-) wheezing      Cardiovascular Exercise Tolerance: Good hypertension (not on medications), (-) CAD and (-) Past MI  Rhythm:Regular Rate:Normal - Systolic murmurs and - Diastolic murmurs    Neuro/Psych negative neurological ROS  negative psych ROS   GI/Hepatic Neg liver ROS, Hx of biliary stricture, now with elevated LFTs and hepatic duct dilation on MRCP    Endo/Other  diabetes (diet controlled), Type 2  Renal/GU negative Renal ROS     Musculoskeletal negative musculoskeletal ROS (+)   Abdominal (+) + obese,   Peds  Hematology negative hematology ROS (+)   Anesthesia Other Findings   Reproductive/Obstetrics                             Anesthesia Physical Anesthesia Plan  ASA: II  Anesthesia Plan: General   Post-op Pain Management:    Induction: Intravenous  Airway Management Planned: Natural Airway  Additional Equipment:   Intra-op Plan:   Post-operative Plan:   Informed Consent: I have reviewed the patients History and Physical, chart, labs and discussed the procedure including the risks, benefits and alternatives for the proposed anesthesia with the patient or authorized representative who has indicated his/her understanding and acceptance.   Dental advisory given  Plan Discussed with: CRNA and Anesthesiologist  Anesthesia Plan Comments:         Anesthesia Quick Evaluation

## 2016-06-19 NOTE — Anesthesia Postprocedure Evaluation (Signed)
Anesthesia Post Note  Patient: Kimberly Brady  Procedure(s) Performed: Procedure(s) (LRB): ENDOSCOPIC RETROGRADE CHOLANGIOPANCREATOGRAPHY (ERCP) (N/A)  Patient location during evaluation: PACU Anesthesia Type: General Level of consciousness: awake and alert Pain management: pain level controlled Vital Signs Assessment: post-procedure vital signs reviewed and stable Respiratory status: spontaneous breathing, nonlabored ventilation and respiratory function stable Cardiovascular status: blood pressure returned to baseline and stable Postop Assessment: no signs of nausea or vomiting Anesthetic complications: no    Last Vitals:  Vitals:   06/19/16 1725 06/19/16 1735  BP: 107/69 100/80  Pulse: 80   Resp: 15   Temp: (!) 35.9 C     Last Pain:  Vitals:   06/19/16 1725  TempSrc: Tympanic  PainSc:                  Herbert Aguinaldo

## 2016-06-20 ENCOUNTER — Other Ambulatory Visit: Payer: Self-pay | Admitting: Internal Medicine

## 2016-06-20 ENCOUNTER — Telehealth: Payer: Self-pay | Admitting: Internal Medicine

## 2016-06-20 DIAGNOSIS — Z803 Family history of malignant neoplasm of breast: Secondary | ICD-10-CM | POA: Diagnosis not present

## 2016-06-20 DIAGNOSIS — Z833 Family history of diabetes mellitus: Secondary | ICD-10-CM | POA: Diagnosis not present

## 2016-06-20 DIAGNOSIS — Z1231 Encounter for screening mammogram for malignant neoplasm of breast: Secondary | ICD-10-CM

## 2016-06-20 DIAGNOSIS — E1136 Type 2 diabetes mellitus with diabetic cataract: Secondary | ICD-10-CM | POA: Diagnosis not present

## 2016-06-20 DIAGNOSIS — Z800 Family history of malignant neoplasm of digestive organs: Secondary | ICD-10-CM | POA: Diagnosis not present

## 2016-06-20 DIAGNOSIS — Z801 Family history of malignant neoplasm of trachea, bronchus and lung: Secondary | ICD-10-CM | POA: Diagnosis not present

## 2016-06-20 DIAGNOSIS — Z87891 Personal history of nicotine dependence: Secondary | ICD-10-CM | POA: Diagnosis not present

## 2016-06-20 DIAGNOSIS — E119 Type 2 diabetes mellitus without complications: Secondary | ICD-10-CM | POA: Diagnosis not present

## 2016-06-20 DIAGNOSIS — Z8619 Personal history of other infectious and parasitic diseases: Secondary | ICD-10-CM | POA: Diagnosis not present

## 2016-06-20 DIAGNOSIS — R17 Unspecified jaundice: Secondary | ICD-10-CM | POA: Diagnosis not present

## 2016-06-20 DIAGNOSIS — R748 Abnormal levels of other serum enzymes: Secondary | ICD-10-CM | POA: Diagnosis not present

## 2016-06-20 DIAGNOSIS — K831 Obstruction of bile duct: Secondary | ICD-10-CM | POA: Diagnosis not present

## 2016-06-20 DIAGNOSIS — K759 Inflammatory liver disease, unspecified: Secondary | ICD-10-CM | POA: Diagnosis not present

## 2016-06-20 LAB — LIPASE, BLOOD: LIPASE: 25 U/L (ref 11–51)

## 2016-06-20 LAB — COMPREHENSIVE METABOLIC PANEL
ALBUMIN: 3.6 g/dL (ref 3.5–5.0)
ALT: 456 U/L — AB (ref 14–54)
AST: 198 U/L — AB (ref 15–41)
Alkaline Phosphatase: 216 U/L — ABNORMAL HIGH (ref 38–126)
Anion gap: 3 — ABNORMAL LOW (ref 5–15)
BUN: 8 mg/dL (ref 6–20)
CHLORIDE: 103 mmol/L (ref 101–111)
CO2: 31 mmol/L (ref 22–32)
CREATININE: 0.72 mg/dL (ref 0.44–1.00)
Calcium: 8.7 mg/dL — ABNORMAL LOW (ref 8.9–10.3)
GFR calc Af Amer: >60 mL/min (ref >60)
GLUCOSE: 158 mg/dL — AB (ref 65–99)
POTASSIUM: 3.7 mmol/L (ref 3.5–5.1)
SODIUM: 137 mmol/L (ref 135–145)
Total Bilirubin: 2.3 mg/dL — ABNORMAL HIGH (ref 0.3–1.2)
Total Protein: 6.8 g/dL (ref 6.5–8.1)

## 2016-06-20 LAB — URINE CULTURE

## 2016-06-20 LAB — GLUCOSE, CAPILLARY: Glucose-Capillary: 215 mg/dL — ABNORMAL HIGH (ref 65–99)

## 2016-06-20 NOTE — Telephone Encounter (Signed)
Noted. Will follow as appropriate.  

## 2016-06-20 NOTE — Telephone Encounter (Signed)
Noted, thanks,.

## 2016-06-20 NOTE — Progress Notes (Signed)
Discharge paperwork reviewed with patient who verbalized understanding. Patient's husband to transport home.

## 2016-06-20 NOTE — Telephone Encounter (Signed)
HFU, Pt is being discharged today from the hospital. Dx was Jaundice. Pt needs a 1 week f/u. No appt avail to sch. Let me know where to sch? Thank you!

## 2016-06-20 NOTE — Discharge Summary (Signed)
[place] [facility] [place] NAME: Kimberly Brady    MR#:  [id number]  DATE OF BIRTH:  [birth year]  DATE OF ADMISSION:  06/18/2016   ADMITTING PHYSICIAN: Hillary Bow, MD  DATE OF DISCHARGE: 06/20/2016 10:02 AM  PRIMARY CARE PHYSICIAN: Crecencio Mc, MD   ADMISSION DIAGNOSIS:   Jaundice [R17] Common bile duct (CBD) obstruction [K83.1]  DISCHARGE DIAGNOSIS:   Active Problems:   Jaundice   Liver enzyme elevation   Common bile duct (CBD) obstruction   SECONDARY DIAGNOSIS:   Past Medical History:  Diagnosis Date  . Diabetes mellitus    type 2  . Hypertension    situational, pt states only at doctors office, never medicated  . Obesity (BMI 30-39.9)     HOSPITAL COURSE:   64y/o F with PMH of DM, h/o biliary stricture s/p stent and removal about 10 years ago, admitted with elevated LFts and jaundice  #1 Elevated LFTs and obstructive jaundice- [place] with CBD dilation, MRCP with benign stricture -Status post ERCP done yesterday with 2 biliary stents in place. -Improving bilirubin. LFTs are still high. Lipase is within normal limits. -Patient has clinically improved. Follow-up LFTs in 1 week - Tolerating soft diet. Being discharged today  #2 DM-  a1c of 6.7 -Diet-controlled diabetes.  Being discharged today.   DISCHARGE CONDITIONS:   Stable  CONSULTS OBTAINED:   Treatment Team:  Lucilla Lame, MD  DRUG ALLERGIES:   No Known Allergies DISCHARGE MEDICATIONS:     Medication List    TAKE these medications   TRUE METRIX BLOOD GLUCOSE TEST test strip Generic drug:  glucose blood TEST ONCE DAILY   TRUEPLUS LANCETS 30G Misc TEST ONCE DAILY        DISCHARGE INSTRUCTIONS:   1. PCP f/u in 1-2 weeks 2. LFTs in 1 week  DIET:   Cardiac diet  ACTIVITY:   Activity as tolerated  OXYGEN:   Home Oxygen: No.  Oxygen Delivery: room air  DISCHARGE LOCATION:   home   If you experience worsening of your admission  symptoms, develop shortness of breath, life threatening emergency, suicidal or homicidal thoughts you must seek medical attention immediately by calling 911 or calling your MD immediately  if symptoms less severe.  You Must read complete instructions/literature along with all the possible adverse reactions/side effects for all the Medicines you take and that have been prescribed to you. Take any new Medicines after you have completely understood and accpet all the possible adverse reactions/side effects.   Please note  You were cared for by a hospitalist during your hospital stay. If you have any questions about your discharge medications or the care you received while you were in the hospital after you are discharged, you can call the unit and asked to speak with the hospitalist on call if the hospitalist that took care of you is not available. Once you are discharged, your primary care physician will handle any further medical issues. Please note that NO REFILLS for any discharge medications will be authorized once you are discharged, as it is imperative that you return to your primary care physician (or establish a relationship with a primary care physician if you do not have one) for your aftercare needs so that they can reassess your need for medications and monitor your lab values.    On the day of Discharge:  VITAL SIGNS:   Blood pressure (!) 125/55, pulse (!) 53, temperature 98.6 F (37 C), temperature source  Oral, resp. rate 18, height 5\' 2"  (1.575 m), weight 98.6 kg (217 lb 7 oz), SpO2 95 %.  PHYSICAL EXAMINATION:   GENERAL:  65 y.o.-year-old patient lying in the bed with no acute distress.  EYES: Pupils equal, round, reactive to light and accommodation. no scleral icterus. Extraocular muscles intact.  HEENT: Head atraumatic, normocephalic. Oropharynx and nasopharynx clear.  NECK:  Supple, no jugular venous distention. No thyroid enlargement, no tenderness.  LUNGS: Normal breath  sounds bilaterally, no wheezing, rales,rhonchi or crepitation. No use of accessory muscles of respiration.  CARDIOVASCULAR: S1, S2 normal. No murmurs, rubs, or gallops.  ABDOMEN: Soft, nontender, nondistended. Bowel sounds present. No organomegaly or mass.  EXTREMITIES: No pedal edema, cyanosis, or clubbing.  NEUROLOGIC: Cranial nerves II through XII are intact. Muscle strength 5/5 in all extremities. Sensation intact. Gait not checked.  PSYCHIATRIC: The patient is alert and oriented x 3.  SKIN: No obvious rash, lesion, or ulcer.    DATA REVIEW:   CBC  Recent Labs Lab 06/18/16 1336  WBC 3.7  HGB 15.6  HCT 44.7  PLT 160    Chemistries   Recent Labs Lab 06/20/16 0325  NA 137  K 3.7  CL 103  CO2 31  GLUCOSE 158*  BUN 8  CREATININE 0.72  CALCIUM 8.7*  AST 198*  ALT 456*  ALKPHOS 216*  BILITOT 2.3*     Microbiology Results  Results for orders placed or performed during the hospital encounter of 06/18/16  Urine culture     Status: Abnormal   Collection Time: 06/18/16  8:39 AM  Result Value Ref Range Status   Specimen Description URINE, CLEAN CATCH  Final   Special Requests NONE  Final   Culture MULTIPLE SPECIES PRESENT, SUGGEST RECOLLECTION (A)  Final   Report Status 06/20/2016 FINAL  Final    RADIOLOGY:  No results found.   Management plans discussed with the patient, family and they are in agreement.  CODE STATUS:  Code Status History    Date Active Date Inactive Code Status Order ID Comments User Context   06/18/2016  5:39 PM 06/20/2016  1:10 PM Full Code [id number]  Hillary Bow, MD ED      TOTAL TIME TAKING CARE OF THIS PATIENT: 38 minutes.    Marquail Bradwell M.D on 06/20/2016 at 3:50 PM  Between 7am to 6pm - Pager - [phone]  After 6pm go to [email or web address] - Proofreader  [facility]  Office  [phone]  CC: Primary care physician; Crecencio Mc, MD   Note: This dictation was prepared with Dragon  dictation along with smaller phrase technology. Any transcriptional errors that result from this process are unintentional.

## 2016-06-23 ENCOUNTER — Telehealth: Payer: Self-pay

## 2016-06-23 ENCOUNTER — Other Ambulatory Visit: Payer: Self-pay | Admitting: Internal Medicine

## 2016-06-23 NOTE — Telephone Encounter (Signed)
Placed on the schedule on Tullo's thanks

## 2016-06-23 NOTE — Telephone Encounter (Signed)
Transition Care Management Follow-up Telephone Call   Date discharged? 06/20/16   How have you been since you were released from the hospital? [place] SUBSIDED.  NO PAIN.  NO NAUSEA, FEVER, VOMITING. VERY LITTLE DISCOMFORT.  BLOOD SUGARS WITHIN RANGE.   Do you understand why you were in the hospital? YES, NAUSEA, FEVER, ACUTE HEPATITIS JAUNDICE.   Do you understand the discharge instructions? YES, INCREASE ACTIVITY AS TOLERATED.   Where were you discharged to? HOME.   Items Reviewed:  Medications reviewed: YES TAKING SCHEDULED MEDICATIONS AS APPROPRIATE.   Allergies reviewed: YES, NO KNOWN ALLERGIES.  Dietary changes reviewed: YES,  REGULAR DIET.   Referrals reviewed: YES, PCP. FOLLOW UP WITH LFTs.   Functional Questionnaire:   Activities of Daily Living (ADLs):   She states they are independent in the following: Independent in all ADLs. States they require assistance with the following: Does not require assistance at this time.   Any transportation issues/concerns?: NO.   Any patient concerns? None at this time.   Confirmed importance and date/time of follow-up visits scheduled YES, appointment scheduled 06/30/16 at 11:30.  Provider Appointment booked with Dr. Derrel Nip (PCP).  Confirmed with patient if condition begins to worsen call PCP or go to the ER.  Patient was given the office number and encouraged to call back with question or concerns.  : YES, VERBALIZED UNDERSTANDING.

## 2016-06-23 NOTE — Telephone Encounter (Signed)
Please schedule TCM-HFU on 06/30/16 at 11:30. Dx acute hepatitis jaundice.  Dr. Derrel Nip to make aware if conflict in schedule.  Patient's brother has passed away and is unable to come any sooner.

## 2016-06-27 ENCOUNTER — Ambulatory Visit: Payer: 59

## 2016-06-29 ENCOUNTER — Ambulatory Visit: Admission: RE | Admit: 2016-06-29 | Payer: 59 | Source: Ambulatory Visit

## 2016-06-30 ENCOUNTER — Encounter: Payer: Self-pay | Admitting: Internal Medicine

## 2016-06-30 ENCOUNTER — Ambulatory Visit (INDEPENDENT_AMBULATORY_CARE_PROVIDER_SITE_OTHER): Payer: 59 | Admitting: Internal Medicine

## 2016-06-30 VITALS — BP 118/76 | HR 67 | Temp 98.1°F | Resp 14 | Ht 62.0 in | Wt 217.1 lb

## 2016-06-30 DIAGNOSIS — K831 Obstruction of bile duct: Secondary | ICD-10-CM | POA: Diagnosis not present

## 2016-06-30 DIAGNOSIS — E669 Obesity, unspecified: Secondary | ICD-10-CM

## 2016-06-30 DIAGNOSIS — E785 Hyperlipidemia, unspecified: Secondary | ICD-10-CM | POA: Diagnosis not present

## 2016-06-30 DIAGNOSIS — E119 Type 2 diabetes mellitus without complications: Secondary | ICD-10-CM

## 2016-06-30 DIAGNOSIS — K8050 Calculus of bile duct without cholangitis or cholecystitis without obstruction: Secondary | ICD-10-CM | POA: Diagnosis not present

## 2016-06-30 DIAGNOSIS — E1169 Type 2 diabetes mellitus with other specified complication: Secondary | ICD-10-CM

## 2016-06-30 DIAGNOSIS — Z09 Encounter for follow-up examination after completed treatment for conditions other than malignant neoplasm: Secondary | ICD-10-CM

## 2016-06-30 LAB — COMPREHENSIVE METABOLIC PANEL
ALK PHOS: 90 U/L (ref 39–117)
ALT: 43 U/L — ABNORMAL HIGH (ref 0–35)
AST: 18 U/L (ref 0–37)
Albumin: 4.1 g/dL (ref 3.5–5.2)
BUN: 19 mg/dL (ref 6–23)
CO2: 29 meq/L (ref 19–32)
Calcium: 9.1 mg/dL (ref 8.4–10.5)
Chloride: 103 mEq/L (ref 96–112)
Creatinine, Ser: 0.71 mg/dL (ref 0.40–1.20)
GFR: 88.37 mL/min (ref >60.00)
GLUCOSE: 137 mg/dL — AB (ref 70–99)
POTASSIUM: 4.5 meq/L (ref 3.5–5.1)
Sodium: 135 mEq/L (ref 135–145)
TOTAL PROTEIN: 7.3 g/dL (ref 6.0–8.3)
Total Bilirubin: 0.9 mg/dL (ref 0.2–1.2)

## 2016-06-30 LAB — LIPID PANEL
Cholesterol: 190 mg/dL (ref 0–200)
HDL: 45.2 mg/dL (ref >39.00)
LDL Cholesterol: 118 mg/dL — ABNORMAL HIGH (ref 0–99)
NONHDL: 145.25
Total CHOL/HDL Ratio: 4
Triglycerides: 134 mg/dL (ref 0.0–149.0)
VLDL: 26.8 mg/dL (ref 0.0–40.0)

## 2016-06-30 LAB — LIPASE: LIPASE: 29 U/L (ref 11.0–59.0)

## 2016-06-30 NOTE — Patient Instructions (Signed)
You can continue the metformin 500 mg twice daily for now.  I do not need to see you in 2 weeks, since you A1c was done And we are repeating the other labs today  I'll see you in 3 months

## 2016-06-30 NOTE — Progress Notes (Signed)
Pre visit review using our clinic review tool, if applicable. No additional management support is needed unless otherwise documented below in the visit note. 

## 2016-06-30 NOTE — Progress Notes (Signed)
Subjective:  Patient ID: Kimberly Brady, female    DOB: 18-Nov-1950  Age: 65 y.o. MRN: [id number]  CC: The primary encounter diagnosis was Choledocholithiasis. Diagnoses of Hyperlipidemia with target LDL less than 70, Diabetes mellitus type 2 in obese [facility]), Common bile duct (CBD) obstruction, and Hospital discharge follow-up were also pertinent to this visit.  HPI Kimberly Brady presents for hospital follow up.  Patient was admitted to [facility] on  August 27 after developing jaundice and tea colored urine. with jaundice.  She was admitted for workup which included MRCP followed by ERCP for management of  Biliary stricture  With 2 stents placed by Lucilla Lame.  Brushings were not done because of bleeding noted  at time of procedure,  Stents are to be removed in 4 months .  Her symptoms and signs have resolved.   No nausea or dark urine since discharge.  She notes some minor discomfort in the mid epigastric region, which is improving.  Lalbs and imagins studies done during hospitalization were reviewed with patient today.   Lab Results  Component Value Date   HGBA1C 6.8 (H) 06/16/2016    She has been taking 500 mg metformin bid for elevated sugars since she has returned home  (borrowed from husband)    Outpatient Medications Prior to Visit  Medication Sig Dispense Refill  . TRUE METRIX BLOOD GLUCOSE TEST test strip TEST ONCE DAILY (Patient not taking: Reported on 06/30/2016) 100 each 1  . TRUEPLUS LANCETS 30G MISC TEST ONCE DAILY (Patient not taking: Reported on 06/30/2016) 100 each 1   No facility-administered medications prior to visit.     Review of Systems;  Patient denies headache, fevers, malaise, unintentional weight loss, skin rash, eye pain, sinus congestion and sinus pain, sore throat, dysphagia,  hemoptysis , cough, dyspnea, wheezing, chest pain, palpitations, orthopnea, edema, abdominal pain, nausea, melena, diarrhea, constipation, flank pain, dysuria, hematuria, urinary   Frequency, nocturia, numbness, tingling, seizures,  Focal weakness, Loss of consciousness,  Tremor, insomnia, depression, anxiety, and suicidal ideation.      Objective:  BP 118/76 (BP Location: Left Arm, Patient Position: Sitting, Cuff Size: Normal)   Pulse 67   Temp 98.1 F (36.7 C) (Oral)   Resp 14   Ht 5\' 2"  (1.575 m)   Wt 217 lb 2 oz (98.5 kg)   SpO2 98%   BMI 39.71 kg/m   BP Readings from Last 3 Encounters:  06/30/16 118/76  06/20/16 (!) 125/55  06/18/16 (!) 161/78    Wt Readings from Last 3 Encounters:  06/30/16 217 lb 2 oz (98.5 kg)  06/20/16 217 lb 7 oz (98.6 kg)  06/18/16 220 lb (99.8 kg)    General appearance: alert, cooperative and appears stated age Ears: normal TM's and external ear canals both ears Throat: lips, mucosa, and tongue normal; teeth and gums normal Neck: no adenopathy, no carotid bruit, supple, symmetrical, trachea midline and thyroid not enlarged, symmetric, no tenderness/mass/nodules Back: symmetric, no curvature. ROM normal. No CVA tenderness. Lungs: clear to auscultation bilaterally Heart: regular rate and rhythm, S1, S2 normal, no murmur, click, rub or gallop Abdomen: soft, non-tender; bowel sounds normal; no masses,  no organomegaly Pulses: 2+ and symmetric Skin: Skin color, texture, turgor normal. No rashes or lesions Lymph nodes: Cervical, supraclavicular, and axillary nodes normal.  Lab Results  Component Value Date   HGBA1C 6.8 (H) 06/16/2016   HGBA1C 6.5 (H) 12/24/2015   HGBA1C 6.3 (A) 10/30/2014    Lab Results  Component Value  Date   CREATININE 0.71 06/30/2016   CREATININE 0.72 06/20/2016   CREATININE 0.54 06/19/2016    Lab Results  Component Value Date   WBC 3.7 06/18/2016   HGB 15.6 06/18/2016   HCT 44.7 06/18/2016   PLT 160 06/18/2016   GLUCOSE 137 (H) 06/30/2016   CHOL 190 06/30/2016   TRIG 134.0 06/30/2016   HDL 45.20 06/30/2016   LDLCALC 118 (H) 06/30/2016   ALT 43 (H) 06/30/2016   AST 18 06/30/2016   NA  135 06/30/2016   K 4.5 06/30/2016   CL 103 06/30/2016   CREATININE 0.71 06/30/2016   BUN 19 06/30/2016   CO2 29 06/30/2016   INR 0.85 06/18/2016   HGBA1C 6.8 (H) 06/16/2016   MICROALBUR <0.7 12/31/2015    Mr Abd W/wo Cm/mrcp  Result Date: 06/19/2016 CLINICAL DATA:  Abdominal pain and nausea with dark urine and light color stool for several days. History of cholecystectomy, biliary stricture with stenting and diabetes. Elevated serum transaminase, alkaline phosphatase and total bilirubin levels. EXAM: MRI ABDOMEN WITHOUT AND WITH CONTRAST (INCLUDING MRCP) TECHNIQUE: Multiplanar multisequence MR imaging of the abdomen was performed both before and after the administration of intravenous contrast. Heavily T2-weighted images of the biliary and pancreatic ducts were obtained, and three-dimensional MRCP images were rendered by post processing. CONTRAST:  69mL MULTIHANCE GADOBENATE DIMEGLUMINE 529 MG/ML IV SOLN COMPARISON:  Ultrasound 06/18/2016, CT 07/26/2007 and MRCP 06/07/2006. FINDINGS: Lower chest:  Unremarkable. Hepatobiliary: The liver demonstrates mild heterogeneous steatosis with loss of signal on the opposed phase gradient echo images. There are no focal hepatic lesions. There is no abdominal hepatic enhancement following contrast. Status post cholecystectomy. Biliary stent demonstrated on prior CT is no longer visualized. There is chronic intrahepatic biliary dilatation with dilatation of the left intrahepatic ducts to 10 mm. There is smooth focal narrowing of the common hepatic duct. The common bile duct is normal in caliber. Small cystic duct remnant is noted. No intraductal filling defects or abnormal biliary wall enhancement seen. Pancreas: Unremarkable. There is no evidence of pancreatic mass, surrounding inflammation or fluid collection. There is no pancreatic ductal dilatation. No evidence of pancreas divisum. Spleen: Normal in size without focal abnormality. Adrenals/Urinary Tract: Both  adrenal glands appear normal. The kidneys appear normal without evidence of urinary tract calculus or hydronephrosis. Bladder not imaged. Stomach/Bowel: No evidence of bowel wall thickening, distention or surrounding inflammatory change. Vascular/Lymphatic: There are no enlarged abdominal lymph nodes. No significant vascular findings are present. Other: None. Musculoskeletal: No acute or significant osseous findings. Mild scoliosis. IMPRESSION: 1. Intrahepatic biliary dilatation, asymmetrically involving the left hepatic lobe. Overall appearance is similar to MRCP from 2007 and presumably secondary to benign biliary stricture given chronicity. No definite superimposed choledocholithiasis. If the patient has persistent unexplained elevated liver function studies, ERCP may be warranted. 2. The pancreas and pancreatic duct appear normal. 3. Mild hepatic steatosis.  No focal hepatic lesions. Electronically Signed   By: Richardean Sale M.D.   On: 06/19/2016 12:41   US Abdomen Limited Ruq  Result Date: 06/18/2016 CLINICAL DATA:  Three day history of jaundice. EXAM: US ABDOMEN LIMITED - RIGHT UPPER QUADRANT COMPARISON:  CT scan 07/26/2007.  Ultrasound exam 07/25/2007. FINDINGS: Gallbladder: Surgically absent. Common bile duct: Diameter: Extrahepatic common duct measures 11 mm diameter. Previous CT scan showed evidence of a common bile duct stent in place at that time. Common bile duct in the head of the pancreas on today's study is not seen secondary to overlying bowel gas. Liver: Coarsened  echotexture with poor acoustic through transmission, features suggesting steatosis. Mild intrahepatic biliary duct dilatation is suggested. IMPRESSION: 1. Extrahepatic common duct measures up to 11 mm in this patient with previous common bile duct manipulation. The distal common bile duct cannot be visualized on today's study secondary to midline overlying bowel gas. 2. Steatosis. 3. Consider CT or MRI imaging to further evaluate.  Electronically Signed   By: Misty Stanley M.D.   On: 06/18/2016 17:12    Assessment & Plan:   Problem List Items Addressed This Visit    Diabetes mellitus type 2 in obese [facility])    Historically Well-controlled without Victoza,  With recent elevation in sugars due to acute choldocholithiasis, now resolved.  Continue metformin until sugars start to normalize. Urine Microalb/cr ratio was normal  .  She is due for annual  eye exams and her foot exam is normal. Using RYR due to history of prior statin intolerance..  Lab Results  Component Value Date   HGBA1C 6.8 (H) 06/16/2016   Lab Results  Component Value Date   MICROALBUR <0.7 12/31/2015    Lab Results  Component Value Date   CREATININE 0.71 06/30/2016                 Relevant Medications   metFORMIN (GLUCOPHAGE) 500 MG tablet   Common bile duct (CBD) obstruction    Now resolved,  Secondary to stricture, s/p stenting via ERCP done August 28 by Lucilla Lame.  Stents to be removed in 4 months. Burshings of bile duct to be done at time of removal. Repeat LFTS have normalized  Lab Results  Component Value Date   ALT 43 (H) 06/30/2016   AST 18 06/30/2016   ALKPHOS 90 06/30/2016   BILITOT 0.9 06/30/2016           Hospital discharge follow-up    Patient is stable post discharge and has no new issues or questions about discharge plans at the visit today for hospital follow up.  I have reviewed the records from the hospital admission in detail with patient today.      Hyperlipidemia with target LDL less than 70   Relevant Orders   Lipid panel (Completed)    Other Visit Diagnoses    Choledocholithiasis    -  Primary   Relevant Orders   Lipase (Completed)   Comprehensive metabolic panel (Completed)      I am having Ms. Bozza maintain her TRUEPLUS LANCETS 30G, TRUE METRIX BLOOD GLUCOSE TEST, and metFORMIN.  Meds ordered this encounter  Medications  . metFORMIN (GLUCOPHAGE) 500 MG tablet    Sig: Take 500 mg by  mouth 2 (two) times daily with a meal.    There are no discontinued medications.  Follow-up: Return in about 3 months (around 09/29/2016) for follow up diabetes, CANCEL the one in 2 weeks .   Crecencio Mc, MD

## 2016-07-02 ENCOUNTER — Encounter: Payer: Self-pay | Admitting: Internal Medicine

## 2016-07-02 DIAGNOSIS — Z09 Encounter for follow-up examination after completed treatment for conditions other than malignant neoplasm: Secondary | ICD-10-CM | POA: Insufficient documentation

## 2016-07-02 NOTE — Assessment & Plan Note (Signed)
Patient is stable post discharge and has no new issues or questions about discharge plans at the visit today for hospital follow up.  I have reviewed the records from the hospital admission in detail with patient today. 

## 2016-07-02 NOTE — Assessment & Plan Note (Addendum)
Now resolved,  Secondary to stricture, s/p stenting via ERCP done August 28 by Lucilla Lame.  Stents to be removed in 4 months. Burshings of bile duct to be done at time of removal. Repeat LFTS have normalized  Lab Results  Component Value Date   ALT 43 (H) 06/30/2016   AST 18 06/30/2016   ALKPHOS 90 06/30/2016   BILITOT 0.9 06/30/2016

## 2016-07-02 NOTE — Assessment & Plan Note (Signed)
Historically Well-controlled without Victoza,  With recent elevation in sugars due to acute choldocholithiasis, now resolved.  Continue metformin until sugars start to normalize. Urine Microalb/cr ratio was normal  .  She is due for annual  eye exams and her foot exam is normal. Using RYR due to history of prior statin intolerance..  Lab Results  Component Value Date   HGBA1C 6.8 (H) 06/16/2016   Lab Results  Component Value Date   MICROALBUR <0.7 12/31/2015    Lab Results  Component Value Date   CREATININE 0.71 06/30/2016

## 2016-07-06 ENCOUNTER — Ambulatory Visit
Admission: RE | Admit: 2016-07-06 | Discharge: 2016-07-06 | Disposition: A | Discharge status: Home / Self Care | Payer: 59 | Source: Ambulatory Visit | Attending: Gastroenterology | Admitting: Gastroenterology

## 2016-07-06 ENCOUNTER — Other Ambulatory Visit: Payer: Self-pay

## 2016-07-06 ENCOUNTER — Telehealth: Payer: Self-pay

## 2016-07-06 ENCOUNTER — Other Ambulatory Visit
Admission: RE | Admit: 2016-07-06 | Discharge: 2016-07-06 | Disposition: A | Discharge status: Home / Self Care | Payer: 59 | Source: Ambulatory Visit | Attending: Gastroenterology | Admitting: Gastroenterology

## 2016-07-06 DIAGNOSIS — R17 Unspecified jaundice: Secondary | ICD-10-CM

## 2016-07-06 DIAGNOSIS — I700 Atherosclerosis of aorta: Secondary | ICD-10-CM | POA: Diagnosis not present

## 2016-07-06 DIAGNOSIS — K449 Diaphragmatic hernia without obstruction or gangrene: Secondary | ICD-10-CM | POA: Insufficient documentation

## 2016-07-06 DIAGNOSIS — K838 Other specified diseases of biliary tract: Secondary | ICD-10-CM | POA: Insufficient documentation

## 2016-07-06 DIAGNOSIS — R1084 Generalized abdominal pain: Secondary | ICD-10-CM

## 2016-07-06 DIAGNOSIS — E119 Type 2 diabetes mellitus without complications: Secondary | ICD-10-CM

## 2016-07-06 DIAGNOSIS — K831 Obstruction of bile duct: Secondary | ICD-10-CM

## 2016-07-06 LAB — LIPASE, BLOOD: Lipase: 23 U/L (ref 11–51)

## 2016-07-06 MED ORDER — IOPAMIDOL (ISOVUE-370) INJECTION 76%
100.0000 mL | Freq: Once | INTRAVENOUS | Status: AC | PRN
  Administered 2016-07-06: 100 mL via INTRAVENOUS

## 2016-07-06 NOTE — Telephone Encounter (Signed)
Kimberly Brady had an ERCP performed by Dr. Allen Norris on 06/19/2016. After she had her procedure done, she felt uncomfortable and pressure in her abdomen. Little by little, the pressure kept increasing and now she is in a lot of pain. She states that the pain is radiating to her back and back to the areas where the stents were placed. She said it feels like her stomach is full of air. It's always hard and hurts for her to sit or eat anything. I spoke with Ginger and she told me that the stents do not cause that type of pain. She should contact her PCP to try to figure out what is causing her pain. Lakema said she knows the pain is coming from the stents. I'm going to have Ginger call her and speak with her directly as there is nothing else I can tell her.

## 2016-07-07 NOTE — Telephone Encounter (Signed)
Returned pt's call yesterday regarding her symptoms. I spoke with Dr. Allen Norris and he requested pt to repeat Lipase lab. We received a negative results and pt was informed. He also stated pt could have the stent removed on Friday (07/07/16) if she felt it was causing her pain or we could order a stat CT scan abdomen/pelvis. Pt decided to have the Ct scan which came back with some improved CBD dilation and no infection. Pt was satisfied with these results and will not have stent removed. She will continue to monitor her pain and call me back if pain worsens or develops a fever.

## 2016-07-14 ENCOUNTER — Ambulatory Visit: Payer: 59 | Admitting: Internal Medicine

## 2016-07-14 ENCOUNTER — Ambulatory Visit: Payer: 59 | Admitting: Pharmacist

## 2016-08-03 ENCOUNTER — Encounter: Payer: Self-pay | Admitting: Gastroenterology

## 2016-08-04 ENCOUNTER — Ambulatory Visit: Payer: 59 | Admitting: Pharmacist

## 2016-08-18 ENCOUNTER — Encounter: Payer: Self-pay | Admitting: Pharmacist

## 2016-08-18 ENCOUNTER — Other Ambulatory Visit: Payer: Self-pay | Admitting: Pharmacist

## 2016-08-18 NOTE — Patient Outreach (Signed)
[facility]) Care Management  [place] 10/18/1951 [id number]  Subjective: Patient presents today for 3 month diabetes follow-up as part of the employer-sponsored [employer] to Wellness program.  Not currently on any antidiabetic agetnts.  She reports she has had 2 bile duct stents at the end of August of 2017 which she states have increased her blood glucose.  She reports that she has dawn phenomenon which she has occasional elevated fasting blood glucose.  She has had some abdominal pain but she believes her blood glucose has been higher due to stress.  Her stents are to be taken out on 09/19/16 at [facility].  Abdominal pain that radiates to back which is not constant. She did have a CT scan because of the abdominal pain.  She started back on metformin on 9/8 but had more pain with metformin and has now self discontinued this. She reports she is still having some abdominal pain but not as bad as when she was on metformin.  Previously on Victoza but had elevated bilirubin.  She states she has never been on a statin.  No pain today but has been as high as 6-7/10 on pain scale feels it more when she is trying to sleep.   Patient reported dietary habits: Eats 2-3 meals/day Breakfast:atkins bar/yogurt  Lunch:salad or healthy choice meal.  Dinner:meat + several vegetables (brocolli, squash, sweet potatoes) Snacks:bedtime snack sometimes protein and carbohydrate but sometimes popcorn or cereal + almond milk.   Drinks:water, flavored ice water drinks, coffee with unsweetened creamer, occasional diet coke.   Patient reported exercise habits: no regular exercise but occasional walking/hiking once weekly when off work.    Patient denies hypoglycemic events. Patient denies nocturia.  Patient denies pain/burning upon urination.  Patient denies neuropathy. Patient denies visual changes. Has eye exam scheduled in November 2017. Does have cataracts in both eyes.   Reports dentist Dr Landry Mellow within past 6 months.  Patient reports self foot exams. Denies changes or problems.   Patient reported self monitored blood glucose frequency couple times a week.  Home fasting CBG 14 day average: 145 mg/dL    Objective:  Lab Results  Component Value Date   HGBA1C 6.8 (H) 06/16/2016   Vitals:   08/18/16 1427  BP: 130/70  Pulse: 66    Lipid Panel     Component Value Date/Time   CHOL 190 06/30/2016 1208   CHOL 180 10/30/2014 0815   TRIG 134.0 06/30/2016 1208   TRIG 118 10/30/2014 0815   HDL 45.20 06/30/2016 1208   HDL 48 10/30/2014 0815   CHOLHDL 4 06/30/2016 1208   VLDL 26.8 06/30/2016 1208   VLDL 24 10/30/2014 0815   LDLCALC 118 (H) 06/30/2016 1208   LDLCALC 108 (H) 10/30/2014 0815     Encounter Medications: Outpatient Encounter Prescriptions as of 08/18/2016  Medication Sig  . TRUE METRIX BLOOD GLUCOSE TEST test strip TEST ONCE DAILY  . TRUEPLUS LANCETS 30G MISC TEST ONCE DAILY  . [DISCONTINUED] metFORMIN (GLUCOPHAGE) 500 MG tablet Take 500 mg by mouth 2 (two) times daily with a meal.   No facility-administered encounter medications on file as of 08/18/2016.     Functional Status: In your present state of health, do you have any difficulty performing the following activities: 08/18/2016 06/18/2016  Hearing? N -  Vision? N -  Difficulty concentrating or making decisions? N -  Walking or climbing stairs? N -  Dressing or bathing? N -  Doing errands,  shopping? N N  Some recent data might be hidden    Fall/Depression Screening: PHQ 2/9 Scores 08/18/2016 01/21/2016 06/11/2015  PHQ - 2 Score 0 0 0    Assessment:  Diabetes: Most recent A1C was 6.8% which is at  goal of less than 7%. She recently stopped metformin due to abdominal pain which may be related to her bile duct surgery.  She is not currently on a statin or aspirin. She does have statin intolerance in her chart but does not remember ever receiving one. She does have history of  elevated liver function tests and bile duct obstruction which may contraindicate her to statins.    Plan/Goals for Next Visit: Counseled on low carbohydrate diet and increasing exercise  Patient has set goal to get A1C <6.5% and to start walking 30 minutes per day for 3 days per week.   Next appointment to see me is: 3 months  Bennye Alm, PharmD [facility] PGY2 Pharmacy Resident [phone]

## 2016-09-08 DIAGNOSIS — H52203 Unspecified astigmatism, bilateral: Secondary | ICD-10-CM | POA: Diagnosis not present

## 2016-09-08 LAB — HM DIABETES EYE EXAM

## 2016-09-12 ENCOUNTER — Encounter: Payer: Self-pay | Admitting: Internal Medicine

## 2016-09-19 ENCOUNTER — Ambulatory Visit: Payer: 59 | Admitting: Anesthesiology

## 2016-09-19 ENCOUNTER — Encounter
Admission: RE | Disposition: A | Discharge status: Home / Self Care | Payer: Self-pay | Source: Ambulatory Visit | Attending: Gastroenterology

## 2016-09-19 ENCOUNTER — Ambulatory Visit
Admission: RE | Admit: 2016-09-19 | Discharge: 2016-09-19 | Disposition: A | Discharge status: Home / Self Care | Payer: 59 | Source: Ambulatory Visit | Attending: Gastroenterology | Admitting: Gastroenterology

## 2016-09-19 DIAGNOSIS — Z6838 Body mass index (BMI) 38.0-38.9, adult: Secondary | ICD-10-CM | POA: Insufficient documentation

## 2016-09-19 DIAGNOSIS — Z9049 Acquired absence of other specified parts of digestive tract: Secondary | ICD-10-CM | POA: Diagnosis not present

## 2016-09-19 DIAGNOSIS — E119 Type 2 diabetes mellitus without complications: Secondary | ICD-10-CM | POA: Diagnosis not present

## 2016-09-19 DIAGNOSIS — Z4659 Encounter for fitting and adjustment of other gastrointestinal appliance and device: Secondary | ICD-10-CM

## 2016-09-19 DIAGNOSIS — Z9689 Presence of other specified functional implants: Secondary | ICD-10-CM | POA: Diagnosis not present

## 2016-09-19 DIAGNOSIS — E669 Obesity, unspecified: Secondary | ICD-10-CM | POA: Insufficient documentation

## 2016-09-19 DIAGNOSIS — Z6839 Body mass index (BMI) 39.0-39.9, adult: Secondary | ICD-10-CM | POA: Diagnosis not present

## 2016-09-19 DIAGNOSIS — K9189 Other postprocedural complications and disorders of digestive system: Secondary | ICD-10-CM | POA: Diagnosis not present

## 2016-09-19 DIAGNOSIS — K831 Obstruction of bile duct: Secondary | ICD-10-CM | POA: Insufficient documentation

## 2016-09-19 DIAGNOSIS — I10 Essential (primary) hypertension: Secondary | ICD-10-CM | POA: Diagnosis not present

## 2016-09-19 DIAGNOSIS — R748 Abnormal levels of other serum enzymes: Secondary | ICD-10-CM | POA: Diagnosis not present

## 2016-09-19 HISTORY — PX: ERCP: SHX5425

## 2016-09-19 LAB — GLUCOSE, CAPILLARY: GLUCOSE-CAPILLARY: 110 mg/dL — AB (ref 65–99)

## 2016-09-19 SURGERY — ERCP, WITH INTERVENTION IF INDICATED
Anesthesia: General

## 2016-09-19 MED ORDER — SODIUM CHLORIDE 0.9 % IV SOLN
INTRAVENOUS | Status: DC
  Administered 2016-09-19: 1000 mL via INTRAVENOUS

## 2016-09-19 MED ORDER — PROPOFOL 10 MG/ML IV BOLUS
INTRAVENOUS | Status: DC | PRN
  Administered 2016-09-19: 70 mg via INTRAVENOUS
  Administered 2016-09-19: 30 mg via INTRAVENOUS

## 2016-09-19 MED ORDER — LIDOCAINE 2% (20 MG/ML) 5 ML SYRINGE
INTRAMUSCULAR | Status: DC | PRN
  Administered 2016-09-19: 25 mg via INTRAVENOUS

## 2016-09-19 MED ORDER — PROPOFOL 500 MG/50ML IV EMUL
INTRAVENOUS | Status: DC | PRN
Start: 2016-09-19 — End: 2016-09-19
  Administered 2016-09-19: 120 ug/kg/min via INTRAVENOUS

## 2016-09-19 MED ORDER — MIDAZOLAM HCL 5 MG/5ML IJ SOLN
INTRAMUSCULAR | Status: DC | PRN
  Administered 2016-09-19: 1 mg via INTRAVENOUS

## 2016-09-19 MED ORDER — GLYCOPYRROLATE 0.2 MG/ML IJ SOLN
INTRAMUSCULAR | Status: DC | PRN
  Administered 2016-09-19: 0.2 mg via INTRAVENOUS

## 2016-09-19 MED ORDER — FENTANYL CITRATE (PF) 100 MCG/2ML IJ SOLN
INTRAMUSCULAR | Status: DC | PRN
  Administered 2016-09-19: 50 ug via INTRAVENOUS

## 2016-09-19 NOTE — Op Note (Signed)
[facility] Patient Name: Kimberly Brady Procedure Date: 09/19/2016 10:56 AM MRN: [id number] Account #: [id number] Date of Birth: 07/05/1951 Admit Type: Outpatient Age: 65 Room: [facility] ENDO ROOM 4 Gender: Female Note Status: Finalized Procedure:            ERCP Indications:          Biliary stent removal Providers:            Lucilla Lame MD, MD Referring MD:         Deborra Medina, MD (Referring MD) Medicines:            Propofol per Anesthesia Complications:        No immediate complications. Procedure:            Pre-Anesthesia Assessment:                       - Prior to the procedure, a History and Physical was                        performed, and patient medications and allergies were                        reviewed. The patient's tolerance of previous                        anesthesia was also reviewed. The risks and benefits of                        the procedure and the sedation options and risks were                        discussed with the patient. All questions were                        answered, and informed consent was obtained. Prior                        Anticoagulants: The patient has taken no previous                        anticoagulant or antiplatelet agents. ASA Grade                        Assessment: II - A patient with mild systemic disease.                        After reviewing the risks and benefits, the patient was                        deemed in satisfactory condition to undergo the                        procedure.                       After obtaining informed consent, the scope was passed                        under direct vision. Throughout the procedure, the  patient's blood pressure, pulse, and oxygen saturations                        were monitored continuously. The Endoscope was                        introduced through the mouth, and used to inject                        contrast into  and used to inject contrast into the bile                        duct. The ERCP was accomplished without difficulty. The                        patient tolerated the procedure well. Findings:      A scout film of the abdomen was obtained. Two stents ending in the main       bile duct were seen. Two plastic stents originating in the biliary tree       were emerging from the major papilla. Two stents were removed from the       common bile duct using a snare. A wire was passed into the biliary tree.       The bile duct was deeply cannulated with the short-nosed traction       sphincterotome. Contrast was injected. I personally interpreted the bile       duct images. There was brisk flow of contrast through the ducts. Image       quality was excellent. Contrast extended to the entire biliary tree. The       entire biliary tree was diffusely dilated, acquired. The common hepatic       duct contained a single segmental stenosis. The biliary tree was swept       with a 15 mm balloon starting at the bifurcation. Sludge was swept from       the duct. Impression:           - Two stents from the biliary tree were seen in the                        major papilla.                       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                       - The entire biliary tree was dilated, acquired.                       - Two stents were removed from the common bile duct.                       - The biliary tree was swept and sludge was found. Recommendation:       - Watch for pancreatitis, bleeding, perforation, and                        cholangitis.                       -  Discharge patient to home.                       - Resume previous diet.                       - Continue present medications.                       - If patient becomes jaundice again then a metal                        covered stent should be considered. Procedure  Code(s):    --- Professional ---                       [phone], Endoscopic retrograde cholangiopancreatography                        (ERCP); with removal of foreign body(s) or stent(s)                        from biliary/pancreatic duct(s)                       [id number], Endoscopic retrograde cholangiopancreatography                        (ERCP); with removal of calculi/debris from                        biliary/pancreatic duct(s)                       [id number], Endoscopic catheterization of the biliary ductal                        system, radiological supervision and interpretation Diagnosis Code(s):    --- Professional ---                       Z96.89, Presence of other specified functional implants                       K91.89, Other postprocedural complications and                        disorders of digestive system                       Z46.59, Encounter for fitting and adjustment of other                        gastrointestinal appliance and device                       K83.1, Obstruction of bile duct                       K83.8, Other specified diseases of biliary tract CPT copyright 2016 American Medical Association. All rights reserved. The codes documented in this report are preliminary and upon coder review may  be revised to meet current compliance requirements. Lucilla Lame MD, MD 09/19/2016 11:29:21 AM This report has been signed electronically. Number of Addenda: 0 Note Initiated On: 09/19/2016 10:56  [facility]

## 2016-09-19 NOTE — H&P (Signed)
Kimberly Lame, MD [address] [place], [place] [address] Phone: [phone] Fax : [phone]  Primary Care Physician:  Kimberly Mc, MD Primary Gastroenterologist:  Dr. Allen Norris  Pre-Procedure History & Physical: HPI:  Kimberly Brady is a 65 y.o. female is here for an ERCP.   Past Medical History:  Diagnosis Date  . Diabetes mellitus    type 2  . Obesity (BMI 30-39.9)     Past Surgical History:  Procedure Laterality Date  . CHOLECYSTECTOMY  2009   lapaoscopic  . COLONOSCOPY WITH PROPOFOL N/A 06/25/2015   Procedure: COLONOSCOPY WITH PROPOFOL;  Surgeon: Kimberly Lame, MD;  Location: [place];  Service: Endoscopy;  Laterality: N/A;  Diabetic - diet controlled  . ERCP N/A 06/19/2016   Procedure: ENDOSCOPIC RETROGRADE CHOLANGIOPANCREATOGRAPHY (ERCP);  Surgeon: Kimberly Lame, MD;  Location: [facility] ENDOSCOPY;  Service: Endoscopy;  Laterality: N/A;  . EYE SURGERY  Jan 2013   serial cataract extreaction, intraocular lense  . POLYPECTOMY  06/25/2015   Procedure: POLYPECTOMY;  Surgeon: Kimberly Lame, MD;  Location: [place];  Service: Endoscopy;;  . SMALL INTESTINE SURGERY  2008   Biliary stent placement,  removal  . TOTAL ABDOMINAL HYSTERECTOMY W/ BILATERAL SALPINGOOPHORECTOMY  1986   for polycystic ovaries,endometriosis    Prior to Admission medications   Medication Sig Start Date End Date Taking? Authorizing Provider  TRUE METRIX BLOOD GLUCOSE TEST test strip TEST ONCE DAILY 06/23/16  Yes Kimberly Mc, MD  TRUEPLUS LANCETS 30G MISC TEST ONCE DAILY 06/23/16  Yes Kimberly Mc, MD    Allergies as of 07/06/2016  . (No Known Allergies)    Family History  Problem Relation Age of Onset  . Diabetes Mother     type 2  . Cancer Mother     colon,breast  . Cancer Father     lung  . Breast cancer Sister   . Colon cancer Brother   . Lung cancer Brother     Social History   Social History  . Marital status: Married    Spouse name: N/A  .  Number of children: N/A  . Years of education: N/A   Occupational History  . Not on file.   Social History Main Topics  . Smoking status: Never Smoker  . Smokeless tobacco: Never Used  . Alcohol use No  . Drug use: No  . Sexual activity: Not on file   Other Topics Concern  . Not on file   Social History Narrative   THERE ARE LAB RESULTS IN THE PAPER CHART BUT NOT UPDATED IN COMPUTER SYSTEM AS I WAS UNABLE TO READ THE RESULTS ACCURATELY...             Review of Systems: See HPI, otherwise negative ROS  Physical Exam: BP (!) 142/66   Pulse (!) 59   Temp 97.7 F (36.5 C) (Tympanic)   Resp 16   Ht 5\' 3"  (1.6 m)   Wt 215 lb (97.5 kg)   SpO2 97%   BMI 38.09 kg/m  General:   Alert,  pleasant and cooperative in NAD Head:  Normocephalic and atraumatic. Neck:  Supple; no masses or thyromegaly. Lungs:  Clear throughout to auscultation.    Heart:  Regular rate and rhythm. Abdomen:  Soft, nontender and nondistended. Normal bowel sounds, without guarding, and without rebound.   Neurologic:  Alert and  oriented x4;  grossly normal neurologically.  Impression/Plan: [name] is here for an ERCP to be performed for  stent removal  Risks, benefits, limitations, and alternatives regarding  ERCP have been reviewed with the patient.  Questions have been answered.  All parties agreeable.   Kimberly Lame, MD  09/19/2016, 10:26 AM

## 2016-09-19 NOTE — Anesthesia Postprocedure Evaluation (Signed)
Anesthesia Post Note  Patient: Kimberly Brady  Procedure(s) Performed: Procedure(s) (LRB): ENDOSCOPIC RETROGRADE CHOLANGIOPANCREATOGRAPHY (ERCP) (N/A)  Patient location during evaluation: Endoscopy Anesthesia Type: General Level of consciousness: awake and alert and oriented Pain management: pain level controlled Vital Signs Assessment: post-procedure vital signs reviewed and stable Respiratory status: spontaneous breathing, nonlabored ventilation and respiratory function stable Cardiovascular status: blood pressure returned to baseline and stable Postop Assessment: no signs of nausea or vomiting Anesthetic complications: no    Last Vitals:  Vitals:   09/19/16 1150 09/19/16 1200  BP: 124/65 126/81  Pulse: 64   Resp: 17   Temp:      Last Pain:  Vitals:   09/19/16 1014  TempSrc: Tympanic                 [name]

## 2016-09-19 NOTE — Transfer of Care (Signed)
Immediate Anesthesia Transfer of Care Note  Patient: Kimberly Brady  Procedure(s) Performed: Procedure(s): ENDOSCOPIC RETROGRADE CHOLANGIOPANCREATOGRAPHY (ERCP) (N/A)  Patient Location: Endoscopy Unit  Anesthesia Type:General  Level of Consciousness: awake  Airway & Oxygen Therapy: Patient Spontanous Breathing and Patient connected to nasal cannula oxygen  Post-op Assessment: Report given to RN  Post vital signs: Reviewed  Last Vitals:  Vitals:   09/19/16 1014 09/19/16 1133  BP: (!) 142/66 123/75  Pulse: (!) 59 68  Resp: 16 16  Temp: 36.5 C 36.2 C    Last Pain:  Vitals:   09/19/16 1014  TempSrc: Tympanic         Complications: No apparent anesthesia complications

## 2016-09-19 NOTE — OR Nursing (Signed)
2 stents removed without difficulty.

## 2016-09-19 NOTE — Anesthesia Preprocedure Evaluation (Addendum)
Anesthesia Evaluation  Patient identified by MRN, date of birth, ID band Patient awake    Reviewed: Allergy & Precautions, NPO status , Patient's Chart, lab work & pertinent test results, reviewed documented beta blocker date and time   Airway Mallampati: II  TM Distance: >3 FB     Dental  (+) Chipped   Pulmonary           Cardiovascular hypertension, Pt. on medications      Neuro/Psych    GI/Hepatic   Endo/Other  diabetes, Type 2  Renal/GU      Musculoskeletal   Abdominal   Peds  Hematology   Anesthesia Other Findings Has had multiple stents placed (7 - 8). Last one 3 months ago. Obese.  Reproductive/Obstetrics                           Anesthesia Physical Anesthesia Plan  ASA: III  Anesthesia Plan: General   Post-op Pain Management:    Induction: Intravenous  Airway Management Planned:   Additional Equipment:   Intra-op Plan:   Post-operative Plan:   Informed Consent: I have reviewed the patients History and Physical, chart, labs and discussed the procedure including the risks, benefits and alternatives for the proposed anesthesia with the patient or authorized representative who has indicated his/her understanding and acceptance.     Plan Discussed with: CRNA  Anesthesia Plan Comments:        Anesthesia Quick Evaluation

## 2016-09-20 ENCOUNTER — Encounter: Payer: Self-pay | Admitting: Gastroenterology

## 2016-09-29 ENCOUNTER — Ambulatory Visit: Payer: 59 | Admitting: Internal Medicine

## 2016-10-13 ENCOUNTER — Ambulatory Visit: Payer: 59 | Admitting: Internal Medicine

## 2016-11-10 ENCOUNTER — Ambulatory Visit (INDEPENDENT_AMBULATORY_CARE_PROVIDER_SITE_OTHER): Payer: 59 | Admitting: Internal Medicine

## 2016-11-10 ENCOUNTER — Encounter: Payer: Self-pay | Admitting: Internal Medicine

## 2016-11-10 VITALS — BP 126/82 | HR 81 | Temp 98.4°F | Resp 16 | Ht 63.0 in | Wt 220.1 lb

## 2016-11-10 DIAGNOSIS — Z23 Encounter for immunization: Secondary | ICD-10-CM

## 2016-11-10 DIAGNOSIS — M21612 Bunion of left foot: Secondary | ICD-10-CM

## 2016-11-10 DIAGNOSIS — K831 Obstruction of bile duct: Secondary | ICD-10-CM

## 2016-11-10 DIAGNOSIS — E669 Obesity, unspecified: Secondary | ICD-10-CM

## 2016-11-10 DIAGNOSIS — E1169 Type 2 diabetes mellitus with other specified complication: Secondary | ICD-10-CM

## 2016-11-10 LAB — COMPREHENSIVE METABOLIC PANEL
ALT: 21 U/L (ref 0–35)
AST: 15 U/L (ref 0–37)
Albumin: 4.3 g/dL (ref 3.5–5.2)
Alkaline Phosphatase: 70 U/L (ref 39–117)
BILIRUBIN TOTAL: 0.5 mg/dL (ref 0.2–1.2)
BUN: 17 mg/dL (ref 6–23)
CO2: 28 meq/L (ref 19–32)
Calcium: 9.7 mg/dL (ref 8.4–10.5)
Chloride: 102 mEq/L (ref 96–112)
Creatinine, Ser: 0.75 mg/dL (ref 0.40–1.20)
GFR: 82.86 mL/min (ref >60.00)
GLUCOSE: 134 mg/dL — AB (ref 70–99)
Potassium: 4.3 mEq/L (ref 3.5–5.1)
SODIUM: 138 meq/L (ref 135–145)
Total Protein: 7.5 g/dL (ref 6.0–8.3)

## 2016-11-10 LAB — HEMOGLOBIN A1C: Hgb A1c MFr Bld: 7.2 % — ABNORMAL HIGH (ref 4.6–6.5)

## 2016-11-10 LAB — MICROALBUMIN / CREATININE URINE RATIO
CREATININE, U: 140.4 mg/dL
MICROALB/CREAT RATIO: 0.6 mg/g (ref 0.0–30.0)
Microalb, Ur: 0.8 mg/dL (ref 0.0–1.9)

## 2016-11-10 NOTE — Patient Instructions (Addendum)
I recommending a daily baby aspirin daily   Ok to continue using turmeric   If your A1c is > 7.0,  We will add 2.5 mg glipizide in the evening

## 2016-11-10 NOTE — Progress Notes (Signed)
Pre-visit discussion using our clinic review tool. No additional management support is needed unless otherwise documented below in the visit note.  

## 2016-11-10 NOTE — Assessment & Plan Note (Signed)
S/p biliary stent placement x 2 in August , followed by removal and dilation of biliary duct via ERCP Nov 29

## 2016-11-10 NOTE — Assessment & Plan Note (Signed)
Continue use of toe spacers and wide toe box shoes

## 2016-11-10 NOTE — Assessment & Plan Note (Addendum)
Historically Well-controlled on diet alone. Did not tolerate retrial of metformin due to abd pain.  Urine Microalb/cr ratio  Is repeated today,  She is due for annual  eye exams and her foot exam is normal. Using RYR due to history of prior statin intolerance.. Recommending use of daily baby aspirin  Lab Results  Component Value Date   HGBA1C 6.8 (H) 06/16/2016   Lab Results  Component Value Date   MICROALBUR <0.7 12/31/2015    Lab Results  Component Value Date   CREATININE 0.71 06/30/2016

## 2016-11-10 NOTE — Assessment & Plan Note (Signed)
I have addressed  BMI and recommended wt loss of 10% of body weight over the next 6 months using a low fat, low starch, high protein  fruit/vegetable based Mediterranean diet and 30 minutes of aerobic exercise a minimum of 5 days per week.   

## 2016-11-10 NOTE — Progress Notes (Signed)
Subjective:  Patient ID: Kimberly Brady, female    DOB: January 18, 1951  Age: 66 y.o. MRN: [id number]  CC: The primary encounter diagnosis was Diabetes mellitus type 2 in obese ([place]). Diagnoses of Need for prophylactic vaccination against Streptococcus pneumoniae (pneumococcus), Common bile duct (CBD) obstruction, Bunion of great toe of left foot, and Obesity (BMI 30-39.9) were also pertinent to this visit.  HPI Kimberly Brady presents for 4 month follow up on diabetes.  Patient has no complaints today.  Patient is following a low glycemic index diet and has no prescribed medications .  She had  side effects.  From a trial of metformin that resulted in discontinuation of medication .   Fasting sugars have been under  140  and pre dinner sugars under 100 except on rare occasions. Patient is  walking about 3 times per week .  Patient has had an eye exam in the last 12 months and checks feet regularly for signs of infection.  Patient does not walk barefoot outside,  And denies any numbness tingling or burning in feet. Patient is up to date on all recommended vaccinations  2) choledocholithiasis : with prior stent placement x 2 in August .  She underwent repeat  GI eval by Allen Norris with ERCP on Nov 28  And both biliary stents were removed, the duct was dilated and swept of debris,  There were no post procedure complications.    Diet  Reviewed in detail.  Foot exam normal except for bunions .     Lab Results  Component Value Date   MICROALBUR <0.7 12/31/2015     Lab Results  Component Value Date   HGBA1C 6.8 (H) 06/16/2016     Outpatient Medications Prior to Visit  Medication Sig Dispense Refill  . TRUE METRIX BLOOD GLUCOSE TEST test strip TEST ONCE DAILY 100 each 1  . TRUEPLUS LANCETS 30G MISC TEST ONCE DAILY 100 each 1   No facility-administered medications prior to visit.     Review of Systems;  Patient denies headache, fevers, malaise, unintentional weight loss, skin rash,  eye pain, sinus congestion and sinus pain, sore throat, dysphagia,  hemoptysis , cough, dyspnea, wheezing, chest pain, palpitations, orthopnea, edema, abdominal pain, nausea, melena, diarrhea, constipation, flank pain, dysuria, hematuria, urinary  Frequency, nocturia, numbness, tingling, seizures,  Focal weakness, Loss of consciousness,  Tremor, insomnia, depression, anxiety, and suicidal ideation.      Objective:  BP 126/82   Pulse 81   Temp 98.4 F (36.9 C) (Oral)   Resp 16   Ht 5\' 3"  (1.6 m)   Wt 220 lb 2 oz (99.8 kg)   SpO2 96%   BMI 38.99 kg/m   BP Readings from Last 3 Encounters:  11/10/16 126/82  09/19/16 126/81  08/18/16 130/70    Wt Readings from Last 3 Encounters:  11/10/16 220 lb 2 oz (99.8 kg)  09/19/16 215 lb (97.5 kg)  08/18/16 216 lb (98 kg)    General appearance: alert, cooperative and appears stated age Ears: normal TM's and external ear canals both ears Throat: lips, mucosa, and tongue normal; teeth and gums normal Neck: no adenopathy, no carotid bruit, supple, symmetrical, trachea midline and thyroid not enlarged, symmetric, no tenderness/mass/nodules Back: symmetric, no curvature. ROM normal. No CVA tenderness. Lungs: clear to auscultation bilaterally Heart: regular rate and rhythm, S1, S2 normal, no murmur, click, rub or gallop Abdomen: soft, non-tender; bowel sounds normal; no masses,  no organomegaly Pulses: 2+ and symmetric Skin: Skin color,  texture, turgor normal. No rashes or lesions Lymph nodes: Cervical, supraclavicular, and axillary nodes normal.  Lab Results  Component Value Date   HGBA1C 6.8 (H) 06/16/2016   HGBA1C 6.5 (H) 12/24/2015   HGBA1C 6.3 (A) 10/30/2014    Lab Results  Component Value Date   CREATININE 0.71 06/30/2016   CREATININE 0.72 06/20/2016   CREATININE 0.54 06/19/2016    Lab Results  Component Value Date   WBC 3.7 06/18/2016   HGB 15.6 06/18/2016   HCT 44.7 06/18/2016   PLT 160 06/18/2016   GLUCOSE 137 (H)  06/30/2016   CHOL 190 06/30/2016   TRIG 134.0 06/30/2016   HDL 45.20 06/30/2016   LDLCALC 118 (H) 06/30/2016   ALT 43 (H) 06/30/2016   AST 18 06/30/2016   NA 135 06/30/2016   K 4.5 06/30/2016   CL 103 06/30/2016   CREATININE 0.71 06/30/2016   BUN 19 06/30/2016   CO2 29 06/30/2016   INR 0.85 06/18/2016   HGBA1C 6.8 (H) 06/16/2016   MICROALBUR <0.7 12/31/2015    Ct Abdomen Pelvis W Contrast  Result Date: 07/06/2016 CLINICAL DATA:  Upper mid abdominal pain with eating and drinking for the past 4 days. Previous ERCP with stent placement. EXAM: CT ABDOMEN AND PELVIS WITH CONTRAST TECHNIQUE: Multidetector CT imaging of the abdomen and pelvis was performed using the standard protocol following bolus administration of intravenous contrast. CONTRAST:  100 cc Isovue 370 COMPARISON:  Previous abdomen and pelvis CT examinations, the most recent dated 07/26/2007 and abdomen MR with MRCP dated 06/19/2016. FINDINGS: Lower chest: 4 mm right middle lobe nodule on image number 5. This is unchanged since 09/14/2006, compatible with a benign process. Hepatobiliary: Diffuse low density of the liver relative to the spleen. Cholecystectomy clips. Asymmetrical intrahepatic biliary ductal dilatation is again demonstrated with the left hepatic duct measuring 8 mm in maximum diameter. This measured 10 mm in maximum diameter on 06/19/2016. A stent is demonstrated in the common duct with its tip in the duodenum. The proximal portion of the stent extends into the proximal left hepatic duct with its tip within or indenting into liver at the superior margin of the duct. Pancreas: Common duct stent.  Otherwise, normal appearing pancreas. Spleen: Normal in size without focal abnormality. Adrenals/Urinary Tract: Adrenal glands are unremarkable. Kidneys are normal, without renal calculi, focal lesion, or hydronephrosis. Bladder is unremarkable. Stomach/Bowel: Small sliding hiatal hernia. Normal appearing small bowel and colon. No  evidence of appendicitis. Vascular/Lymphatic: Mild aortic calcifications. Proximal left renal artery calcifications. No enlarged lymph nodes. Reproductive: Surgically absent uterus.  No adnexal masses. Other: Midline surgical scar. Musculoskeletal: Lumbar and lower thoracic spine degenerative changes and scoliosis. IMPRESSION: 1. 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 2. Small sliding hiatal hernia. 3. Aortic atherosclerosis. Electronically Signed   By: Claudie Revering M.D.   On: 07/06/2016 14:39    Assessment & Plan:   Problem List Items Addressed This Visit    Bunion of great toe of left foot    Continue use of toe spacers and wide toe box shoes      Common bile duct (CBD) obstruction    S/p biliary stent placement x 2 in August , followed by removal and dilation of biliary duct via ERCP Nov 29      Diabetes mellitus type 2 in obese ([place]) - Primary    Historically Well-controlled on diet alone. Did not  tolerate retrial of metformin due to abd pain.  Urine Microalb/cr ratio  Is repeated today,  She is due for annual  eye exams and her foot exam is normal. Using RYR due to history of prior statin intolerance.. Recommending use of daily baby aspirin  Lab Results  Component Value Date   HGBA1C 6.8 (H) 06/16/2016   Lab Results  Component Value Date   MICROALBUR <0.7 12/31/2015    Lab Results  Component Value Date   CREATININE 0.71 06/30/2016                 Relevant Orders   Comprehensive metabolic panel   Hemoglobin A1c   Microalbumin / creatinine urine ratio   Obesity (BMI 30-39.9)    I have addressed  BMI and recommended wt loss of 10% of body weight over the next 6 months using a low fat, low starch, high protein  fruit/vegetable based Mediterranean diet and 30 minutes of aerobic exercise a minimum of 5 days per week.         Other Visit  Diagnoses    Need for prophylactic vaccination against Streptococcus pneumoniae (pneumococcus)       Relevant Orders   Pneumococcal polysaccharide vaccine 23-valent greater than or equal to 2yo subcutaneous/IM (Completed)     A total of 25 minutes of face to face time was spent with patient more than half of which was spent in counselling about the above mentioned conditions  and coordination of care  I am having Ms. Wafer maintain her TRUEPLUS LANCETS 30G and TRUE METRIX BLOOD GLUCOSE TEST.  No orders of the defined types were placed in this encounter.   There are no discontinued medications.  Follow-up: Return in about 4 months (around 03/10/2017) for follow up diabetes.   Crecencio Mc, MD

## 2016-11-13 ENCOUNTER — Encounter: Payer: Self-pay | Admitting: Internal Medicine

## 2016-11-13 NOTE — Telephone Encounter (Signed)
Please schedule patient in April after the 19th. Sent to front desk to reschedule.

## 2016-12-14 ENCOUNTER — Other Ambulatory Visit: Payer: Self-pay

## 2016-12-14 ENCOUNTER — Other Ambulatory Visit
Admission: RE | Admit: 2016-12-14 | Discharge: 2016-12-14 | Disposition: A | Discharge status: Home / Self Care | Payer: 59 | Source: Ambulatory Visit | Attending: Gastroenterology | Admitting: Gastroenterology

## 2016-12-14 DIAGNOSIS — R748 Abnormal levels of other serum enzymes: Secondary | ICD-10-CM | POA: Diagnosis not present

## 2016-12-14 LAB — COMPREHENSIVE METABOLIC PANEL
ALK PHOS: 236 U/L — AB (ref 38–126)
ALT: 274 U/L — AB (ref 14–54)
AST: 168 U/L — ABNORMAL HIGH (ref 15–41)
Albumin: 3.8 g/dL (ref 3.5–5.0)
Anion gap: 9 (ref 5–15)
BUN: 10 mg/dL (ref 6–20)
CALCIUM: 9.1 mg/dL (ref 8.9–10.3)
CO2: 26 mmol/L (ref 22–32)
CREATININE: 0.72 mg/dL (ref 0.44–1.00)
Chloride: 99 mmol/L — ABNORMAL LOW (ref 101–111)
Glucose, Bld: 194 mg/dL — ABNORMAL HIGH (ref 65–99)
Potassium: 4.3 mmol/L (ref 3.5–5.1)
Sodium: 134 mmol/L — ABNORMAL LOW (ref 135–145)
Total Bilirubin: 2.1 mg/dL — ABNORMAL HIGH (ref 0.3–1.2)
Total Protein: 7.9 g/dL (ref 6.5–8.1)

## 2016-12-15 ENCOUNTER — Ambulatory Visit
Admission: RE | Admit: 2016-12-15 | Discharge: 2016-12-15 | Disposition: A | Discharge status: Home / Self Care | Payer: 59 | Source: Ambulatory Visit | Attending: Gastroenterology | Admitting: Gastroenterology

## 2016-12-15 ENCOUNTER — Other Ambulatory Visit: Payer: Self-pay

## 2016-12-15 ENCOUNTER — Telehealth: Payer: Self-pay

## 2016-12-15 DIAGNOSIS — Z9049 Acquired absence of other specified parts of digestive tract: Secondary | ICD-10-CM | POA: Insufficient documentation

## 2016-12-15 DIAGNOSIS — R932 Abnormal findings on diagnostic imaging of liver and biliary tract: Secondary | ICD-10-CM | POA: Insufficient documentation

## 2016-12-15 DIAGNOSIS — R945 Abnormal results of liver function studies: Secondary | ICD-10-CM | POA: Diagnosis not present

## 2016-12-15 DIAGNOSIS — R748 Abnormal levels of other serum enzymes: Secondary | ICD-10-CM | POA: Insufficient documentation

## 2016-12-15 NOTE — Telephone Encounter (Signed)
-----   Message from Lucilla Lame, MD sent at 12/14/2016  2:15 PM EST ----- Let the patient know that her liver enzymes are back up. She needs a RUQ u?s and aif a dilated CBD then a repeat ERCP on Monday.

## 2016-12-15 NOTE — Telephone Encounter (Signed)
RUQ abdominal US at [facility] on 12/15/16 at 3:30pm. Pt has been notified of this appt and to stay NPO until after scan.

## 2016-12-18 ENCOUNTER — Telehealth: Payer: Self-pay

## 2016-12-18 ENCOUNTER — Other Ambulatory Visit: Payer: Self-pay

## 2016-12-18 NOTE — Telephone Encounter (Signed)
Pt scheduled for an ERCP on Tuesday, Feb 27th at [facility] with [place].   Please precert for dilated common bile duct K83.8

## 2016-12-18 NOTE — Telephone Encounter (Signed)
-----   Message from Lucilla Lame, MD sent at 12/18/2016 10:38 AM EST ----- The duct is slightly larger and needs an ERCP.

## 2016-12-19 ENCOUNTER — Ambulatory Visit: Payer: 59 | Admitting: Registered Nurse

## 2016-12-19 ENCOUNTER — Encounter
Admission: RE | Disposition: A | Discharge status: Home / Self Care | Payer: Self-pay | Source: Ambulatory Visit | Attending: Gastroenterology

## 2016-12-19 ENCOUNTER — Ambulatory Visit
Admission: RE | Admit: 2016-12-19 | Discharge: 2016-12-19 | Disposition: A | Discharge status: Home / Self Care | Payer: 59 | Source: Ambulatory Visit | Attending: Gastroenterology | Admitting: Gastroenterology

## 2016-12-19 DIAGNOSIS — Z90710 Acquired absence of both cervix and uterus: Secondary | ICD-10-CM | POA: Insufficient documentation

## 2016-12-19 DIAGNOSIS — E119 Type 2 diabetes mellitus without complications: Secondary | ICD-10-CM | POA: Insufficient documentation

## 2016-12-19 DIAGNOSIS — Z6838 Body mass index (BMI) 38.0-38.9, adult: Secondary | ICD-10-CM | POA: Diagnosis not present

## 2016-12-19 DIAGNOSIS — I10 Essential (primary) hypertension: Secondary | ICD-10-CM | POA: Insufficient documentation

## 2016-12-19 DIAGNOSIS — R748 Abnormal levels of other serum enzymes: Secondary | ICD-10-CM | POA: Diagnosis not present

## 2016-12-19 DIAGNOSIS — Z90722 Acquired absence of ovaries, bilateral: Secondary | ICD-10-CM | POA: Diagnosis not present

## 2016-12-19 DIAGNOSIS — E669 Obesity, unspecified: Secondary | ICD-10-CM | POA: Diagnosis not present

## 2016-12-19 DIAGNOSIS — K831 Obstruction of bile duct: Secondary | ICD-10-CM | POA: Diagnosis not present

## 2016-12-19 DIAGNOSIS — Z9049 Acquired absence of other specified parts of digestive tract: Secondary | ICD-10-CM | POA: Diagnosis not present

## 2016-12-19 DIAGNOSIS — K9189 Other postprocedural complications and disorders of digestive system: Secondary | ICD-10-CM

## 2016-12-19 DIAGNOSIS — Z9079 Acquired absence of other genital organ(s): Secondary | ICD-10-CM | POA: Diagnosis not present

## 2016-12-19 HISTORY — DX: Other specified diseases of biliary tract: K83.8

## 2016-12-19 HISTORY — PX: ERCP: SHX5425

## 2016-12-19 LAB — GLUCOSE, CAPILLARY: GLUCOSE-CAPILLARY: 179 mg/dL — AB (ref 65–99)

## 2016-12-19 SURGERY — ERCP, WITH INTERVENTION IF INDICATED
Anesthesia: General

## 2016-12-19 MED ORDER — PROPOFOL 500 MG/50ML IV EMUL
INTRAVENOUS | Status: AC
  Filled 2016-12-19: qty 50

## 2016-12-19 MED ORDER — FENTANYL CITRATE (PF) 100 MCG/2ML IJ SOLN
INTRAMUSCULAR | Status: DC | PRN
  Administered 2016-12-19: 50 ug via INTRAVENOUS

## 2016-12-19 MED ORDER — SODIUM CHLORIDE 0.9 % IJ SOLN
INTRAMUSCULAR | Status: AC
  Filled 2016-12-19: qty 10

## 2016-12-19 MED ORDER — LIDOCAINE HCL (PF) 2 % IJ SOLN
INTRAMUSCULAR | Status: DC | PRN
  Administered 2016-12-19: 80 mg via INTRADERMAL

## 2016-12-19 MED ORDER — PROPOFOL 500 MG/50ML IV EMUL
INTRAVENOUS | Status: DC | PRN
  Administered 2016-12-19: 200 ug/kg/min via INTRAVENOUS

## 2016-12-19 MED ORDER — SODIUM CHLORIDE 0.9 % IV SOLN
INTRAVENOUS | Status: DC
  Administered 2016-12-19: 10:00:00 via INTRAVENOUS

## 2016-12-19 MED ORDER — GLYCOPYRROLATE 0.2 MG/ML IJ SOLN
INTRAMUSCULAR | Status: AC
  Filled 2016-12-19: qty 1

## 2016-12-19 MED ORDER — MIDAZOLAM HCL 2 MG/2ML IJ SOLN
INTRAMUSCULAR | Status: AC
  Filled 2016-12-19: qty 2

## 2016-12-19 MED ORDER — FENTANYL CITRATE (PF) 100 MCG/2ML IJ SOLN
INTRAMUSCULAR | Status: AC
  Filled 2016-12-19: qty 2

## 2016-12-19 MED ORDER — GLYCOPYRROLATE 0.2 MG/ML IJ SOLN
INTRAMUSCULAR | Status: DC | PRN
  Administered 2016-12-19: .2 mg via INTRAVENOUS

## 2016-12-19 MED ORDER — PROPOFOL 10 MG/ML IV BOLUS
INTRAVENOUS | Status: DC | PRN
  Administered 2016-12-19: 70 mg via INTRAVENOUS

## 2016-12-19 MED ORDER — LIDOCAINE HCL (PF) 2 % IJ SOLN
INTRAMUSCULAR | Status: AC
  Filled 2016-12-19: qty 2

## 2016-12-19 MED ORDER — MIDAZOLAM HCL 2 MG/2ML IJ SOLN
INTRAMUSCULAR | Status: DC | PRN
  Administered 2016-12-19: 1 mg via INTRAVENOUS

## 2016-12-19 NOTE — H&P (Signed)
Lucilla Lame, MD [address] [place], [place] [address] Phone: [phone] Fax : [phone]  Primary Care Physician:  Crecencio Mc, MD Primary Gastroenterologist:  Dr. Allen Norris  Pre-Procedure History & Physical: HPI:  Kimberly Brady is a 66 y.o. female is here for an ERCP.   Past Medical History:  Diagnosis Date  . Diabetes mellitus    type 2  . Dilated cbd, acquired   . Obesity (BMI 30-39.9)     Past Surgical History:  Procedure Laterality Date  . CHOLECYSTECTOMY  2009   lapaoscopic  . COLONOSCOPY WITH PROPOFOL N/A 06/25/2015   Procedure: COLONOSCOPY WITH PROPOFOL;  Surgeon: Lucilla Lame, MD;  Location: [place];  Service: Endoscopy;  Laterality: N/A;  Diabetic - diet controlled  . ERCP N/A 06/19/2016   Procedure: ENDOSCOPIC RETROGRADE CHOLANGIOPANCREATOGRAPHY (ERCP);  Surgeon: Lucilla Lame, MD;  Location: [facility] ENDOSCOPY;  Service: Endoscopy;  Laterality: N/A;  . ERCP N/A 09/19/2016   Procedure: ENDOSCOPIC RETROGRADE CHOLANGIOPANCREATOGRAPHY (ERCP);  Surgeon: Lucilla Lame, MD;  Location: [facility] ENDOSCOPY;  Service: Endoscopy;  Laterality: N/A;  . EYE SURGERY  Jan 2013   serial cataract extreaction, intraocular lense  . POLYPECTOMY  06/25/2015   Procedure: POLYPECTOMY;  Surgeon: Lucilla Lame, MD;  Location: [facility];  Service: Endoscopy;;  . SMALL INTESTINE SURGERY  2008   Biliary stent placement,  removal  . TOTAL ABDOMINAL HYSTERECTOMY W/ BILATERAL SALPINGOOPHORECTOMY  1986   for polycystic ovaries,endometriosis    Prior to Admission medications   Medication Sig Start Date End Date Taking? Authorizing Provider  TRUE METRIX BLOOD GLUCOSE TEST test strip TEST ONCE DAILY 06/23/16   Crecencio Mc, MD  TRUEPLUS LANCETS 30G MISC TEST ONCE DAILY 06/23/16   Crecencio Mc, MD    Allergies as of 12/18/2016  . (No Known Allergies)    Family History  Problem Relation Age of Onset  . Diabetes Mother     type 2  . Cancer Mother    colon,breast  . Cancer Father     lung  . Breast cancer Sister   . Colon cancer Brother   . Lung cancer Brother     Social History   Social History  . Marital status: Married    Spouse name: N/A  . Number of children: N/A  . Years of education: N/A   Occupational History  . Not on file.   Social History Main Topics  . Smoking status: Never Smoker  . Smokeless tobacco: Never Used  . Alcohol use No  . Drug use: No  . Sexual activity: Not on file   Other Topics Concern  . Not on file   Social History Narrative   THERE ARE LAB RESULTS IN THE PAPER CHART BUT NOT UPDATED IN COMPUTER SYSTEM AS I WAS UNABLE TO READ THE RESULTS ACCURATELY...             Review of Systems: See HPI, otherwise negative ROS  Physical Exam: BP (!) 152/60   Pulse 63   Resp 18   Ht 5\' 3"  (1.6 m)   Wt 214 lb (97.1 kg)   SpO2 97%   BMI 37.91 kg/m  General:   Alert,  pleasant and cooperative in NAD Head:  Normocephalic and atraumatic. Neck:  Supple; no masses or thyromegaly. Lungs:  Clear throughout to auscultation.    Heart:  Regular rate and rhythm. Abdomen:  Soft, nontender and nondistended. Normal bowel sounds, without guarding, and without rebound.   Neurologic:  Alert  and  oriented x4;  grossly normal neurologically.  Impression/Plan: Kimberly Brady is here for an ERCP to be performed for CBD stricture.  Risks, benefits, limitations, and alternatives regarding  ERCP have been reviewed with the patient.  Questions have been answered.  All parties agreeable.   Lucilla Lame, MD  12/19/2016, 11:21 AM

## 2016-12-19 NOTE — Anesthesia Procedure Notes (Signed)
Date/Time: 12/19/2016 10:49 AM Performed by: Hedda Slade Pre-anesthesia Checklist: Patient identified, Emergency Drugs available, Suction available and Patient being monitored Patient Re-evaluated:Patient Re-evaluated prior to inductionOxygen Delivery Method: Nasal cannula

## 2016-12-19 NOTE — Anesthesia Preprocedure Evaluation (Signed)
Anesthesia Evaluation  Patient identified by MRN, date of birth, ID band Patient awake    Reviewed: Allergy & Precautions, H&P , NPO status , Patient's Chart, lab work & pertinent test results, reviewed documented beta blocker date and time   Airway Mallampati: II   Neck ROM: full    Dental  (+) Poor Dentition   Pulmonary neg pulmonary ROS,    Pulmonary exam normal        Cardiovascular hypertension, On Medications negative cardio ROS Normal cardiovascular exam Rhythm:regular Rate:Normal     Neuro/Psych negative neurological ROS  negative psych ROS   GI/Hepatic negative GI ROS, Neg liver ROS,   Endo/Other  negative endocrine ROSdiabetes  Renal/GU negative Renal ROS  negative genitourinary   Musculoskeletal   Abdominal   Peds  Hematology negative hematology ROS (+)   Anesthesia Other Findings Past Medical History: No date: Diabetes mellitus     Comment: type 2 No date: Dilated cbd, acquired No date: Obesity (BMI 30-39.9) Past Surgical History: 2009: CHOLECYSTECTOMY     Comment: lapaoscopic 06/25/2015: COLONOSCOPY WITH PROPOFOL N/A     Comment: Procedure: COLONOSCOPY WITH PROPOFOL;                Surgeon: Lucilla Lame, MD;  Location: [facility];  Service: Endoscopy;  Laterality:              N/A;  Diabetic - diet controlled 06/19/2016: ERCP N/A     Comment: Procedure: ENDOSCOPIC RETROGRADE               CHOLANGIOPANCREATOGRAPHY (ERCP);  Surgeon:               Lucilla Lame, MD;  Location[facility] ENDOSCOPY;                Service: Endoscopy;  Laterality: N/A; 09/19/2016: ERCP N/A     Comment: Procedure: ENDOSCOPIC RETROGRADE               CHOLANGIOPANCREATOGRAPHY (ERCP);  Surgeon:               Lucilla Lame, MD;  Location: [facility] ENDOSCOPY;                Service: Endoscopy;  Laterality: N/A; Jan 2013: EYE SURGERY     Comment: serial cataract extreaction, intraocular lense 06/25/2015:  POLYPECTOMY     Comment: Procedure: POLYPECTOMY;  Surgeon: Lucilla Lame,              MD;  Location: [facility];  Service:               Endoscopy;; 2008: SMALL INTESTINE SURGERY     Comment: Biliary stent placement,  removal 1986: TOTAL ABDOMINAL HYSTERECTOMY W/ BILATERAL SALP*     Comment: for polycystic ovaries,endometriosis BMI    Body Mass Index:  37.91 kg/m     Reproductive/Obstetrics negative OB ROS                             Anesthesia Physical Anesthesia Plan  ASA: III  Anesthesia Plan: General   Post-op Pain Management:    Induction:   Airway Management Planned:   Additional Equipment:   Intra-op Plan:   Post-operative Plan:   Informed Consent: I have reviewed the patients History and Physical, chart, labs and discussed the procedure including the risks, benefits and alternatives for the proposed  anesthesia with the patient or authorized representative who has indicated his/her understanding and acceptance.   Dental Advisory Given  Plan Discussed with: CRNA  Anesthesia Plan Comments:         Anesthesia Quick Evaluation

## 2016-12-19 NOTE — Op Note (Signed)
[facility] Patient Name: Kimberly Brady Procedure Date: 12/19/2016 10:34 AM MRN: [id number] Account #: [id number] Date of Birth: [birth year] Admit Type: Outpatient Age: 66 Room: [facility] ENDO ROOM 4 Gender: Female Note Status: Finalized Procedure:            ERCP Indications:          Elevated liver enzymes, Common bile duct stricture Providers:            Lucilla Lame MD, MD Medicines:            Propofol per Anesthesia Complications:        No immediate complications. Procedure:            Pre-Anesthesia Assessment:                       - Prior to the procedure, a History and Physical was                        performed, and patient medications and allergies were                        reviewed. The patient's tolerance of previous                        anesthesia was also reviewed. The risks and benefits of                        the procedure and the sedation options and risks were                        discussed with the patient. All questions were                        answered, and informed consent was obtained. Prior                        Anticoagulants: The patient has taken no previous                        anticoagulant or antiplatelet agents. ASA Grade                        Assessment: II - A patient with mild systemic disease.                        After reviewing the risks and benefits, the patient was                        deemed in satisfactory condition to undergo the                        procedure.                       After obtaining informed consent, the scope was passed                        under direct vision. Throughout the procedure, the                        patient's  blood pressure, pulse, and oxygen saturations                        were monitored continuously. The was introduced through                        the mouth, and used to inject contrast into and used to                        inject contrast into the  bile duct. The ERCP was                        accomplished without difficulty. The patient tolerated                        the procedure well. Findings:      The scout film was normal. The esophagus was successfully intubated       under direct vision. The scope was advanced to a normal major papilla in       the descending duodenum without detailed examination of the pharynx,       larynx and associated structures, and upper GI tract. The upper GI tract       was grossly normal. The bile duct was deeply cannulated with the       short-nosed traction sphincterotome. Contrast was injected. I personally       interpreted the bile duct images. There was brisk flow of contrast       through the ducts. Contrast extended to the entire biliary tree. The       common hepatic duct contained a single severe segmental stenosis. A wire       was passed into the biliary tree. Cells for cytology were obtained by       brushing. One 10 Fr by 6 cm covered metal stent was placed 5.5 cm into       the common bile duct. Bile flowed through the stent. The stent was in       good position. 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                       - One covered metal stent was placed into the common                        bile duct. Recommendation:       - Await cytology results.                       - Follow up in 6 months or sooner if any problems. Procedure Code(s):    --- Professional ---                       [id number], Endoscopic retrograde cholangiopancreatography                        (ERCP); with placement of endoscopic stent into biliary  or pancreatic duct, including pre- and post-dilation                        and guide wire passage, when performed, including                        sphincterotomy, when performed, each stent                       [phone], Endoscopic catheterization of  the biliary ductal                        system, radiological supervision and interpretation Diagnosis Code(s):    --- Professional ---                       R74.8, Abnormal levels of other serum enzymes                       K83.1, Obstruction of bile duct                       K91.89, Other postprocedural complications and                        disorders of digestive system CPT copyright 2016 American Medical Association. All rights reserved. The codes documented in this report are preliminary and upon coder review may  be revised to meet current compliance requirements. Lucilla Lame MD, MD 12/19/2016 [id number] AM This report has been signed electronically. Number of Addenda: 0 Note Initiated On: 12/19/2016 10:34 AM      [facility]

## 2016-12-19 NOTE — Transfer of Care (Signed)
Immediate Anesthesia Transfer of Care Note  Patient: Kimberly Brady  Procedure(s) Performed: Procedure(s): ENDOSCOPIC RETROGRADE CHOLANGIOPANCREATOGRAPHY (ERCP) (N/A)  Patient Location: PACU  Anesthesia Type:General  Level of Consciousness: awake, alert  and oriented  Airway & Oxygen Therapy: Patient Spontanous Breathing and Patient connected to nasal cannula oxygen  Post-op Assessment: Report given to RN and Post -op Vital signs reviewed and stable  Post vital signs: Reviewed and stable  Last Vitals:  Vitals:   12/19/16 1129 12/19/16 1131  BP: 123/71 123/71  Pulse: 72 66  Resp: 17 17  Temp: (!) 35.9 C (!) 35.9 C    Last Pain:  Vitals:   12/19/16 1131  TempSrc: Tympanic         Complications: No apparent anesthesia complications

## 2016-12-19 NOTE — Anesthesia Post-op Follow-up Note (Cosign Needed)
Anesthesia QCDR form completed.        

## 2016-12-20 ENCOUNTER — Encounter: Payer: Self-pay | Admitting: Gastroenterology

## 2016-12-20 NOTE — Anesthesia Postprocedure Evaluation (Signed)
Anesthesia Post Note  Patient: Kimberly Brady  Procedure(s) Performed: Procedure(s) (LRB): ENDOSCOPIC RETROGRADE CHOLANGIOPANCREATOGRAPHY (ERCP) (N/A)  Patient location during evaluation: PACU Anesthesia Type: General Level of consciousness: awake and alert Pain management: pain level controlled Vital Signs Assessment: post-procedure vital signs reviewed and stable Respiratory status: spontaneous breathing, nonlabored ventilation, respiratory function stable and patient connected to nasal cannula oxygen Cardiovascular status: blood pressure returned to baseline and stable Postop Assessment: no signs of nausea or vomiting Anesthetic complications: no     Last Vitals:  Vitals:   12/19/16 1139 12/19/16 1159  BP: 140/69 (!) 141/68  Pulse: 68   Resp: 15   Temp:      Last Pain:  Vitals:   12/20/16 0811  TempSrc:   PainSc: 0-No pain                 Molli Barrows

## 2016-12-21 LAB — CYTOLOGY - NON PAP

## 2016-12-22 ENCOUNTER — Other Ambulatory Visit: Payer: Self-pay

## 2016-12-22 MED ORDER — LEVOFLOXACIN 500 MG PO TABS
500.0000 mg | ORAL_TABLET | Freq: Every day | ORAL | 0 refills | Status: DC
Start: 2016-12-22 — End: 2017-02-09

## 2016-12-27 ENCOUNTER — Telehealth: Payer: Self-pay | Admitting: Internal Medicine

## 2016-12-27 DIAGNOSIS — Z1239 Encounter for other screening for malignant neoplasm of breast: Secondary | ICD-10-CM

## 2016-12-27 NOTE — Telephone Encounter (Signed)
Order placed. Pt informed. She will call and schedule mammogram herself. I provided her with the [facility] number.

## 2016-12-27 NOTE — Telephone Encounter (Signed)
Pt sent a Mychart and would like to get a referral to get a Mammo done. [facility] in [place] any day but Friday. Thank you!

## 2017-01-10 ENCOUNTER — Ambulatory Visit
Admission: RE | Admit: 2017-01-10 | Discharge: 2017-01-10 | Disposition: A | Discharge status: Home / Self Care | Payer: 59 | Source: Ambulatory Visit | Attending: Internal Medicine | Admitting: Internal Medicine

## 2017-01-10 DIAGNOSIS — Z1231 Encounter for screening mammogram for malignant neoplasm of breast: Secondary | ICD-10-CM | POA: Insufficient documentation

## 2017-01-10 DIAGNOSIS — Z1239 Encounter for other screening for malignant neoplasm of breast: Secondary | ICD-10-CM

## 2017-01-10 LAB — HM MAMMOGRAPHY

## 2017-01-12 DIAGNOSIS — H26492 Other secondary cataract, left eye: Secondary | ICD-10-CM | POA: Diagnosis not present

## 2017-02-09 ENCOUNTER — Encounter: Payer: Self-pay | Admitting: Internal Medicine

## 2017-02-09 ENCOUNTER — Ambulatory Visit (INDEPENDENT_AMBULATORY_CARE_PROVIDER_SITE_OTHER): Payer: 59 | Admitting: Internal Medicine

## 2017-02-09 ENCOUNTER — Ambulatory Visit: Payer: Self-pay | Admitting: Pharmacist

## 2017-02-09 VITALS — BP 148/86 | HR 77 | Temp 98.4°F | Ht 63.0 in | Wt 214.4 lb

## 2017-02-09 DIAGNOSIS — K831 Obstruction of bile duct: Secondary | ICD-10-CM

## 2017-02-09 DIAGNOSIS — E785 Hyperlipidemia, unspecified: Secondary | ICD-10-CM

## 2017-02-09 DIAGNOSIS — E1169 Type 2 diabetes mellitus with other specified complication: Secondary | ICD-10-CM | POA: Diagnosis not present

## 2017-02-09 DIAGNOSIS — I10 Essential (primary) hypertension: Secondary | ICD-10-CM

## 2017-02-09 DIAGNOSIS — R748 Abnormal levels of other serum enzymes: Secondary | ICD-10-CM | POA: Diagnosis not present

## 2017-02-09 DIAGNOSIS — E669 Obesity, unspecified: Secondary | ICD-10-CM | POA: Diagnosis not present

## 2017-02-09 LAB — COMPREHENSIVE METABOLIC PANEL
ALT: 19 U/L (ref 0–35)
AST: 16 U/L (ref 0–37)
Albumin: 4.4 g/dL (ref 3.5–5.2)
Alkaline Phosphatase: 69 U/L (ref 39–117)
BILIRUBIN TOTAL: 0.8 mg/dL (ref 0.2–1.2)
BUN: 19 mg/dL (ref 6–23)
CALCIUM: 10 mg/dL (ref 8.4–10.5)
CO2: 29 meq/L (ref 19–32)
CREATININE: 0.77 mg/dL (ref 0.40–1.20)
Chloride: 98 mEq/L (ref 96–112)
GFR: 80.32 mL/min (ref >60.00)
Glucose, Bld: 135 mg/dL — ABNORMAL HIGH (ref 70–99)
Potassium: 4.3 mEq/L (ref 3.5–5.1)
SODIUM: 136 meq/L (ref 135–145)
Total Protein: 7.9 g/dL (ref 6.0–8.3)

## 2017-02-09 LAB — MICROALBUMIN / CREATININE URINE RATIO
CREATININE, U: 91.4 mg/dL
Microalb Creat Ratio: 0.8 mg/g (ref 0.0–30.0)

## 2017-02-09 LAB — LIPID PANEL
CHOL/HDL RATIO: 4
Cholesterol: 193 mg/dL (ref 0–200)
HDL: 50.6 mg/dL (ref >39.00)
LDL Cholesterol: 112 mg/dL — ABNORMAL HIGH (ref 0–99)
NONHDL: 142.42
Triglycerides: 152 mg/dL — ABNORMAL HIGH (ref 0.0–149.0)
VLDL: 30.4 mg/dL (ref 0.0–40.0)

## 2017-02-09 LAB — LDL CHOLESTEROL, DIRECT: Direct LDL: 118 mg/dL

## 2017-02-09 LAB — POCT GLYCOSYLATED HEMOGLOBIN (HGB A1C): Hemoglobin A1C: 7.3

## 2017-02-09 NOTE — Progress Notes (Signed)
Pre visit review using our clinic review tool, if applicable. No additional management support is needed unless otherwise documented below in the visit note. 

## 2017-02-09 NOTE — Progress Notes (Signed)
Subjective:  Patient ID: Kimberly Brady, female    DOB: 1951-10-10  Age: 66 y.o. MRN: [id number]  CC: The primary encounter diagnosis was Hyperlipidemia with target LDL less than 70. Diagnoses of Diabetes mellitus type 2 in obese ([place]), Elevated liver enzymes, Common biliary duct stricture, Obesity (BMI 30-39.9), and Essential hypertension were also pertinent to this visit.  HPI Kimberly Brady presents for follow up on type 2 dm and obesity  Hypertension noted last two visits.    Recurrent biliary stricture:  Had an ERCP,  Now had Metal biliary stent placed in February  After liver enzyems became elevated .  Biopsy of stricture was negative . Had post procedure nausea for several days  .   Was given levofloxacin rx  X 10 days,  was afraid to take it but took it as directed and symptoms improved.  Blood sugars improved as well.   Has not had repeat enzymes  Yet .  [facility] calculates a1c to be 6.9  Using fitness tracker to encourage her to be more active for the past month,  Sugars have improved,  Were averaging 180 to 200 during the biliary issue for several weeks.  Have now improved and she is now averaging  more activity; walking on a mini trampoline  Lost 10 lbs  drug illness  Has gained 4  Back     Lab Results  Component Value Date   MICROALBUR <0.7 02/09/2017       Outpatient Medications Prior to Visit  Medication Sig Dispense Refill  . TRUE METRIX BLOOD GLUCOSE TEST test strip TEST ONCE DAILY 100 each 1  . TRUEPLUS LANCETS 30G MISC TEST ONCE DAILY 100 each 1  . levofloxacin (LEVAQUIN) 500 MG tablet Take 1 tablet (500 mg total) by mouth daily. 10 tablet 0   No facility-administered medications prior to visit.     Review of Systems;  Patient denies headache, fevers, malaise, unintentional weight loss, skin rash, eye pain, sinus congestion and sinus pain, sore throat, dysphagia,  hemoptysis , cough, dyspnea, wheezing, chest pain, palpitations, orthopnea,  edema, abdominal pain, nausea, melena, diarrhea, constipation, flank pain, dysuria, hematuria, urinary  Frequency, nocturia, numbness, tingling, seizures,  Focal weakness, Loss of consciousness,  Tremor, insomnia, depression, anxiety, and suicidal ideation.      Objective:  BP (!) 148/86   Pulse 77   Temp 98.4 F (36.9 C) (Oral)   Ht 5\' 3"  (1.6 m)   Wt 214 lb 6.4 oz (97.3 kg)   SpO2 94%   BMI 37.98 kg/m   BP Readings from Last 3 Encounters:  02/09/17 (!) 148/86  12/19/16 (!) 141/68  11/10/16 126/82    Wt Readings from Last 3 Encounters:  02/09/17 214 lb 6.4 oz (97.3 kg)  12/19/16 214 lb (97.1 kg)  11/10/16 220 lb 2 oz (99.8 kg)    General appearance: alert, cooperative and appears stated age Ears: normal TM's and external ear canals both ears Throat: lips, mucosa, and tongue normal; teeth and gums normal Neck: no adenopathy, no carotid bruit, supple, symmetrical, trachea midline and thyroid not enlarged, symmetric, no tenderness/mass/nodules Back: symmetric, no curvature. ROM normal. No CVA tenderness. Lungs: clear to auscultation bilaterally Heart: regular rate and rhythm, S1, S2 normal, no murmur, click, rub or gallop Abdomen: soft, non-tender; bowel sounds normal; no masses,  no organomegaly Pulses: 2+ and symmetric Skin: Skin color, texture, turgor normal. No rashes or lesions Lymph nodes: Cervical, supraclavicular, and axillary nodes normal.  Lab Results  Component  Value Date   HGBA1C 7.3 02/09/2017   HGBA1C 7.2 (H) 11/10/2016   HGBA1C 6.8 (H) 06/16/2016    Lab Results  Component Value Date   CREATININE 0.77 02/09/2017   CREATININE 0.72 12/14/2016   CREATININE 0.75 11/10/2016    Lab Results  Component Value Date   WBC 3.7 06/18/2016   HGB 15.6 06/18/2016   HCT 44.7 06/18/2016   PLT 160 06/18/2016   GLUCOSE 135 (H) 02/09/2017   CHOL 193 02/09/2017   TRIG 152.0 (H) 02/09/2017   HDL 50.60 02/09/2017   LDLDIRECT 118.0 02/09/2017   LDLCALC 112 (H)  02/09/2017   ALT 19 02/09/2017   AST 16 02/09/2017   NA 136 02/09/2017   K 4.3 02/09/2017   CL 98 02/09/2017   CREATININE 0.77 02/09/2017   BUN 19 02/09/2017   CO2 29 02/09/2017   INR 0.85 06/18/2016   HGBA1C 7.3 02/09/2017   MICROALBUR <0.7 02/09/2017    Mm Screening Breast Tomo Bilateral  Result Date: 01/11/2017 CLINICAL DATA:  Screening. EXAM: 2D DIGITAL SCREENING BILATERAL MAMMOGRAM WITH CAD AND ADJUNCT TOMO COMPARISON:  Previous exam(s). ACR Breast Density Category b: There are scattered areas of fibroglandular density. FINDINGS: There are no findings suspicious for malignancy. Images were processed with CAD. IMPRESSION: No mammographic evidence of malignancy. A result letter of this screening mammogram will be mailed directly to the patient. RECOMMENDATION: Screening mammogram in one year. (Code:SM-B-01Y) BI-RADS CATEGORY  1: Negative. Electronically Signed   By: Pamelia Hoit M.D.   On: 01/11/2017 08:07    Assessment & Plan:   Problem List Items Addressed This Visit    Common biliary duct stricture    Recurrent,  s/p stent placed by Lucilla Lame in February       Diabetes mellitus type 2 in obese ([place])    Slight loss of control on diet alone. Did not tolerate retrial of metformin due to abd pain.  Urine Microalb/cr ratio  Is repeated today,  She is due for annual  eye exams and her foot exam is normal. Using RYR due to history of prior statin intolerance.. Recommending use of daily baby aspirin  Lab Results  Component Value Date   HGBA1C 7.3 02/09/2017   Lab Results  Component Value Date   MICROALBUR <0.7 02/09/2017    Lab Results  Component Value Date   CREATININE 0.77 02/09/2017                 Relevant Orders   Microalbumin / creatinine urine ratio (Completed)   POCT HgB A1C (Completed)   Hyperlipidemia with target LDL less than 70 - Primary    LDL is 118  on Red yeast rice .  No changes today.   She has statin intolerance.  Continue daily  aspirin.  Lab Results  Component Value Date   CHOL 193 02/09/2017   HDL 50.60 02/09/2017   LDLCALC 112 (H) 02/09/2017   LDLDIRECT 118.0 02/09/2017   TRIG 152.0 (H) 02/09/2017   CHOLHDL 4 02/09/2017   Lab Results  Component Value Date   ALT 19 02/09/2017   AST 16 02/09/2017   ALKPHOS 69 02/09/2017   BILITOT 0.8 02/09/2017             Relevant Orders   LDL cholesterol, direct (Completed)   Lipid panel (Completed)   Hypertension    She has no prior history of hypertension. shewill check his blood pressure several times over the next 3-4 weeks and to submit readings for evaluation.  Urine microalbumin to creatinine ratio done today is normal.  Lab Results  Component Value Date   MICROALBUR <0.7 02/09/2017        Obesity (BMI 30-39.9)    I have addressed  BMI and recommended wt loss of 10% of body weight over the next 6 months using a low fat, low starch, high protein  fruit/vegetable based Mediterranean diet and 30 minutes of aerobic exercise a minimum of 5 days per week.         Other Visit Diagnoses    Elevated liver enzymes       Relevant Orders   Comprehensive metabolic panel (Completed)      I have discontinued Ms. Alkema's levofloxacin. I am also having her maintain her TRUEPLUS LANCETS 30G and TRUE METRIX BLOOD GLUCOSE TEST.  No orders of the defined types were placed in this encounter.   Medications Discontinued During This Encounter  Medication Reason  . levofloxacin (LEVAQUIN) 500 MG tablet     Follow-up: No Follow-up on file.   Crecencio Mc, MD

## 2017-02-09 NOTE — Patient Instructions (Addendum)
Start checking your blood sugars at a time you don't usually check it )   Such as 2 hours after dinner.  Goal is < 160.  send me readings in a few weeks    The new goals for optimal blood pressure management are 120/70.  Please check your blood pressure a few times at home and send me the readings so I can determine if you need a change in medication   WE WILL REPEAT THE A1C IN 3 MONTHS

## 2017-02-11 ENCOUNTER — Encounter: Payer: Self-pay | Admitting: Internal Medicine

## 2017-02-11 NOTE — Assessment & Plan Note (Signed)
LDL is 118  on Red yeast rice .  No changes today.   She has statin intolerance.  Continue daily aspirin.  Lab Results  Component Value Date   CHOL 193 02/09/2017   HDL 50.60 02/09/2017   LDLCALC 112 (H) 02/09/2017   LDLDIRECT 118.0 02/09/2017   TRIG 152.0 (H) 02/09/2017   CHOLHDL 4 02/09/2017   Lab Results  Component Value Date   ALT 19 02/09/2017   AST 16 02/09/2017   ALKPHOS 69 02/09/2017   BILITOT 0.8 02/09/2017

## 2017-02-11 NOTE — Assessment & Plan Note (Addendum)
She has no prior history of hypertension. shewill check his blood pressure several times over the next 3-4 weeks and to submit readings for evaluation. Urine microalbumin to creatinine ratio done today is normal.  Lab Results  Component Value Date   MICROALBUR <0.7 02/09/2017

## 2017-02-11 NOTE — Assessment & Plan Note (Signed)
Slight loss of control on diet alone. Did not tolerate retrial of metformin due to abd pain.  Urine Microalb/cr ratio  Is repeated today,  She is due for annual  eye exams and her foot exam is normal. Using RYR due to history of prior statin intolerance.. Recommending use of daily baby aspirin  Lab Results  Component Value Date   HGBA1C 7.3 02/09/2017   Lab Results  Component Value Date   MICROALBUR <0.7 02/09/2017    Lab Results  Component Value Date   CREATININE 0.77 02/09/2017

## 2017-02-11 NOTE — Assessment & Plan Note (Signed)
I have addressed  BMI and recommended wt loss of 10% of body weight over the next 6 months using a low fat, low starch, high protein  fruit/vegetable based Mediterranean diet and 30 minutes of aerobic exercise a minimum of 5 days per week.   

## 2017-02-11 NOTE — Assessment & Plan Note (Signed)
Recurrent,  s/p stent placed by Lucilla Lame in February

## 2017-03-01 ENCOUNTER — Encounter: Payer: Self-pay | Admitting: Internal Medicine

## 2017-03-02 MED ORDER — GLUCOSE BLOOD VI STRP: ORAL_STRIP | 1 refills | Status: DC

## 2017-03-02 MED ORDER — LIRAGLUTIDE 18 MG/3ML ~~LOC~~ SOPN: PEN_INJECTOR | SUBCUTANEOUS | 2 refills | Status: DC

## 2017-03-02 MED ORDER — PEN NEEDLES 31G X 6 MM MISC: 0 refills | Status: DC

## 2017-03-08 ENCOUNTER — Telehealth: Payer: Self-pay | Admitting: Internal Medicine

## 2017-03-08 NOTE — Telephone Encounter (Signed)
Patient called and said that Dr. Derrel Nip has prescribed her victoza and was sent to [facility]. [facility] had switched it to metformin since it is the "first choice". She said that she can not take metformin and is wanting to know when this will be fixed. I told her it typically takes 48-72 hours for any prescription (I did ask Cyril Mourning). She said that this was last week when the prescription was sent back. She said [facility] needs the prescription again and it needs to be notated that metformin should not be used. Please cb pt once corrected.  [phone]

## 2017-03-12 NOTE — Telephone Encounter (Signed)
Patient requested  Update on the Victoza.  Please call pt at [phone]

## 2017-03-12 NOTE — Telephone Encounter (Signed)
Spoke with pt and informed her that we had to to a PA on the medication and sometimes they take 7-10 business days to get back. Told pt that once we heard something back from the PA we would let her know. Pt gave a verbal understanding.

## 2017-03-16 ENCOUNTER — Ambulatory Visit: Payer: 59 | Admitting: Internal Medicine

## 2017-03-20 NOTE — Telephone Encounter (Signed)
Pt called back to follow up on her PA. I did inform pt that it can take 7-10 business days and that the 10th day will be on 05/31. Please advise if you have any information for pt. Thank you!

## 2017-03-21 NOTE — Telephone Encounter (Signed)
Spoke with pt and informed her that we had to submit additional information and that we should be hearing a final answer by the end of the week.

## 2017-03-28 NOTE — Telephone Encounter (Signed)
Spoke with pt and informed her that the PA has been submitted and the additional information that was requested has now been faxed twice and we are just waiting on an answer. Explained to the pt that as soon as I get anymore information I would give her a call. Pt gave a verbal understanding.

## 2017-03-28 NOTE — Telephone Encounter (Signed)
Patient requested a call to discuss the prior authorization on victoza Pt contact [phone]

## 2017-03-29 NOTE — Telephone Encounter (Signed)
PA approved from 03/28/2017-03/27/2018, for 12 fills .

## 2017-03-29 NOTE — Telephone Encounter (Signed)
Notified the patient of the approval via the phone. thanks

## 2017-05-11 ENCOUNTER — Ambulatory Visit: Payer: 59 | Admitting: Internal Medicine

## 2017-06-04 ENCOUNTER — Telehealth: Payer: Self-pay | Admitting: Gastroenterology

## 2017-06-04 NOTE — Telephone Encounter (Signed)
Patient left a voice message that her stint has been in about 6 months now. What is next? When does it come out? She would like for you to call her ASAP.

## 2017-06-04 NOTE — Telephone Encounter (Signed)
ERCP was done 12/19/16. You placed a metal stent in [place]. You also stated follow up in 6 months or sooner if having problems. Is this suppose to be to remove the metal stent or an office visit?

## 2017-06-05 NOTE — Telephone Encounter (Signed)
Just ERCP

## 2017-06-06 ENCOUNTER — Other Ambulatory Visit: Payer: Self-pay

## 2017-06-06 DIAGNOSIS — K831 Obstruction of bile duct: Secondary | ICD-10-CM

## 2017-06-14 ENCOUNTER — Other Ambulatory Visit: Payer: Self-pay | Admitting: Internal Medicine

## 2017-06-15 ENCOUNTER — Encounter: Payer: Self-pay | Admitting: Internal Medicine

## 2017-06-15 ENCOUNTER — Ambulatory Visit (INDEPENDENT_AMBULATORY_CARE_PROVIDER_SITE_OTHER): Payer: 59 | Admitting: Internal Medicine

## 2017-06-15 VITALS — BP 112/74 | HR 66 | Temp 98.4°F | Resp 15 | Ht 63.0 in | Wt 205.8 lb

## 2017-06-15 DIAGNOSIS — E785 Hyperlipidemia, unspecified: Secondary | ICD-10-CM

## 2017-06-15 DIAGNOSIS — D692 Other nonthrombocytopenic purpura: Secondary | ICD-10-CM | POA: Diagnosis not present

## 2017-06-15 DIAGNOSIS — E669 Obesity, unspecified: Secondary | ICD-10-CM

## 2017-06-15 DIAGNOSIS — K831 Obstruction of bile duct: Secondary | ICD-10-CM | POA: Diagnosis not present

## 2017-06-15 DIAGNOSIS — E1169 Type 2 diabetes mellitus with other specified complication: Secondary | ICD-10-CM | POA: Diagnosis not present

## 2017-06-15 LAB — CBC WITH DIFFERENTIAL/PLATELET
BASOS ABS: 0 10*3/uL (ref 0.0–0.1)
Basophils Relative: 0.8 % (ref 0.0–3.0)
EOS PCT: 1.8 % (ref 0.0–5.0)
Eosinophils Absolute: 0.1 10*3/uL (ref 0.0–0.7)
HEMATOCRIT: 45.6 % (ref 36.0–46.0)
Hemoglobin: 15.3 g/dL — ABNORMAL HIGH (ref 12.0–15.0)
LYMPHS PCT: 30.6 % (ref 12.0–46.0)
Lymphs Abs: 1.4 10*3/uL (ref 0.7–4.0)
MCHC: 33.5 g/dL (ref 30.0–36.0)
MCV: 91.2 fl (ref 78.0–100.0)
MONOS PCT: 7.3 % (ref 3.0–12.0)
Monocytes Absolute: 0.3 10*3/uL (ref 0.1–1.0)
Neutro Abs: 2.6 10*3/uL (ref 1.4–7.7)
Neutrophils Relative %: 59.5 % (ref 43.0–77.0)
Platelets: 150 10*3/uL (ref 150.0–400.0)
RBC: 4.99 Mil/uL (ref 3.87–5.11)
RDW: 12.9 % (ref 11.5–15.5)
WBC: 4.4 10*3/uL (ref 4.0–10.5)

## 2017-06-15 LAB — COMPREHENSIVE METABOLIC PANEL
ALK PHOS: 59 U/L (ref 39–117)
ALT: 17 U/L (ref 0–35)
AST: 15 U/L (ref 0–37)
Albumin: 4.3 g/dL (ref 3.5–5.2)
BILIRUBIN TOTAL: 0.7 mg/dL (ref 0.2–1.2)
BUN: 15 mg/dL (ref 6–23)
CALCIUM: 9.8 mg/dL (ref 8.4–10.5)
CO2: 30 meq/L (ref 19–32)
Chloride: 102 mEq/L (ref 96–112)
Creatinine, Ser: 0.65 mg/dL (ref 0.40–1.20)
GFR: 97.55 mL/min (ref >60.00)
Glucose, Bld: 109 mg/dL — ABNORMAL HIGH (ref 70–99)
POTASSIUM: 4.6 meq/L (ref 3.5–5.1)
Sodium: 138 mEq/L (ref 135–145)
TOTAL PROTEIN: 7.4 g/dL (ref 6.0–8.3)

## 2017-06-15 LAB — POCT GLYCOSYLATED HEMOGLOBIN (HGB A1C): HEMOGLOBIN A1C: 6.5

## 2017-06-15 LAB — LIPID PANEL
CHOL/HDL RATIO: 4
Cholesterol: 167 mg/dL (ref 0–200)
HDL: 43.4 mg/dL (ref >39.00)
LDL CALC: 102 mg/dL — AB (ref 0–99)
NONHDL: 123.99
Triglycerides: 112 mg/dL (ref 0.0–149.0)
VLDL: 22.4 mg/dL (ref 0.0–40.0)

## 2017-06-15 NOTE — Patient Instructions (Addendum)
You have done very well on the Victoza!  Stay at the 1.2 mcg dose until your weight plateaus.  If you do not tolerate the higher dose,  Resume 1.2 mcg and increase the exercise,

## 2017-06-15 NOTE — Progress Notes (Signed)
Subjective:  Patient ID: Kimberly Brady, female    DOB: 23-Aug-1951  Age: 66 y.o. MRN: [id number]  CC: The primary encounter diagnosis was Diabetes mellitus type 2 in obese ([place]). Diagnoses of Common biliary duct stricture, Purpura ([place]), and Hyperlipidemia with target LDL less than 70 were also pertinent to this visit.  HPI Kimberly Brady presents for 3 month follow up on diabetes.  Patient has no complaints today.  Patient is following a low glycemic index diet and taking all prescribed medications regularly without side effects.  Fasting sugars have been less than 140 most of the time and post prandials have been under 160 except on rare occasions. Patient is exercising about 3 times per week and intentionally trying to lose weight .  Patient has had an eye exam in the last 12 months and checks feet regularly for signs of infection.  Patient does not walk barefoot outside,  And denies any numbness tingling or burning in feet. Patient is up to date on all recommended vaccinations.   Lab Results  Component Value Date   HGBA1C 6.5 06/15/2017   Lab Results  Component Value Date   MICROALBUR <0.7 02/09/2017    Has last 9 lbs since April, when she started victoza  She is tolerating the medication and exercising,  Weight has not plateuaed   Walking on the mini trampoline 3 miles daily (called "rebounding")  Less painful to her right hip .  Sugars are lower  Postprandially all < 140 . At the 1.2 mcg dose  Portion control achieved.    No signs or  sympotms of recurrent bile duct obstructive jaundice.   A1c today is 6.5   Sleeping an average 6.5 hours  per night,  good energy level. .  Exercising in the am   Has noted a macular rash that has been present for several months,  Started with an itchy rash  That occurred on both arms when the biliary obstruction occurred  But has nor resolved. .    Getting stent out Sept 6   Outpatient Medications Prior to Visit  Medication Sig  Dispense Refill  . ACCU-CHEK FASTCLIX LANCETS MISC TEST ONCE DAILY 102 each 0  . glucose blood (TRUE METRIX BLOOD GLUCOSE TEST) test strip TEST ONCE DAILY 100 each 1  . Insulin Pen Needle (PEN NEEDLES) 31G X 6 MM MISC For use with victoza /saxenda 100 each 0  . liraglutide 18 MG/3ML SOPN 0.6 mg  SubQ  once daily for 1 week;  increase to 1.2 mg once daily;  may increase  to 1.8 mg once daily 9 mL 2   No facility-administered medications prior to visit.     Review of Systems;  Patient denies headache, fevers, malaise, unintentional weight loss, skin rash, eye pain, sinus congestion and sinus pain, sore throat, dysphagia,  hemoptysis , cough, dyspnea, wheezing, chest pain, palpitations, orthopnea, edema, abdominal pain, nausea, melena, diarrhea, constipation, flank pain, dysuria, hematuria, urinary  Frequency, nocturia, numbness, tingling, seizures,  Focal weakness, Loss of consciousness,  Tremor, insomnia, depression, anxiety, and suicidal ideation.      Objective:  BP 112/74 (BP Location: Left Arm, Patient Position: Sitting, Cuff Size: Large)   Pulse 66   Temp 98.4 F (36.9 C) (Oral)   Resp 15   Ht 5\' 3"  (1.6 m)   Wt 205 lb 12.8 oz (93.4 kg)   SpO2 98%   BMI 36.46 kg/m   BP Readings from Last 3 Encounters:  06/15/17 112/74  02/09/17 (!) 148/86  12/19/16 (!) 141/68    Wt Readings from Last 3 Encounters:  06/15/17 205 lb 12.8 oz (93.4 kg)  02/09/17 214 lb 6.4 oz (97.3 kg)  12/19/16 214 lb (97.1 kg)    General appearance: alert, cooperative and appears stated age Ears: normal TM's and external ear canals both ears Throat: lips, mucosa, and tongue normal; teeth and gums normal Neck: no adenopathy, no carotid bruit, supple, symmetrical, trachea midline and thyroid not enlarged, symmetric, no tenderness/mass/nodules Back: symmetric, no curvature. ROM normal. No CVA tenderness. Lungs: clear to auscultation bilaterally Heart: regular rate and rhythm, S1, S2 normal, no murmur,  click, rub or gallop Abdomen: soft, non-tender; bowel sounds normal; no masses,  no organomegaly Pulses: 2+ and symmetric Skin: Skin color, texture, turgor normal. No rashes or lesions Lymph nodes: Cervical, supraclavicular, and axillary nodes normal.  Lab Results  Component Value Date   HGBA1C 6.5 06/15/2017   HGBA1C 7.3 02/09/2017   HGBA1C 7.2 (H) 11/10/2016    Lab Results  Component Value Date   CREATININE 0.65 06/15/2017   CREATININE 0.77 02/09/2017   CREATININE 0.72 12/14/2016    Lab Results  Component Value Date   WBC 4.4 06/15/2017   HGB 15.3 (H) 06/15/2017   HCT 45.6 06/15/2017   PLT 150.0 06/15/2017   GLUCOSE 109 (H) 06/15/2017   CHOL 167 06/15/2017   TRIG 112.0 06/15/2017   HDL 43.40 06/15/2017   LDLDIRECT 118.0 02/09/2017   LDLCALC 102 (H) 06/15/2017   ALT 17 06/15/2017   AST 15 06/15/2017   NA 138 06/15/2017   K 4.6 06/15/2017   CL 102 06/15/2017   CREATININE 0.65 06/15/2017   BUN 15 06/15/2017   CO2 30 06/15/2017   INR 0.85 06/18/2016   HGBA1C 6.5 06/15/2017   MICROALBUR <0.7 02/09/2017    Mm Screening Breast Tomo Bilateral  Result Date: 01/11/2017 CLINICAL DATA:  Screening. EXAM: 2D DIGITAL SCREENING BILATERAL MAMMOGRAM WITH CAD AND ADJUNCT TOMO COMPARISON:  Previous exam(s). ACR Breast Density Category b: There are scattered areas of fibroglandular density. FINDINGS: There are no findings suspicious for malignancy. Images were processed with CAD. IMPRESSION: No mammographic evidence of malignancy. A result letter of this screening mammogram will be mailed directly to the patient. RECOMMENDATION: Screening mammogram in one year. (Code:SM-B-01Y) BI-RADS CATEGORY  1: Negative. Electronically Signed   By: Pamelia Hoit M.D.   On: 01/11/2017 08:07    Assessment & Plan:   Problem List Items Addressed This Visit    Common biliary duct stricture    Recurrent,  Presenting with obstructive jaundice Stent to be removed on Sept 5         Diabetes mellitus  type 2 in obese ([place]) - Primary    Improved control with addition of victoza .  She is up to date on annual  eye exams and her foot exam is normal. Using RYR due to history of prior statin intolerance.. Recommending use of daily baby aspirin  Lab Results  Component Value Date   HGBA1C 6.5 06/15/2017   Lab Results  Component Value Date   MICROALBUR <0.7 02/09/2017    Lab Results  Component Value Date   CREATININE 0.65 06/15/2017                 Relevant Orders   POCT HgB A1C (Completed)   Comprehensive metabolic panel (Completed)   Lipid panel (Completed)   Hyperlipidemia with target LDL less than 70    LDL is 118  on Red yeast rice .  No changes today.   She has statin intolerance.  Continue daily aspirin.  Lab Results  Component Value Date   CHOL 167 06/15/2017   HDL 43.40 06/15/2017   LDLCALC 102 (H) 06/15/2017   LDLDIRECT 118.0 02/09/2017   TRIG 112.0 06/15/2017   CHOLHDL 4 06/15/2017   Lab Results  Component Value Date   ALT 17 06/15/2017   AST 15 06/15/2017   ALKPHOS 59 06/15/2017   BILITOT 0.7 06/15/2017             Purpura ([facility])    Her rash has persisted since her episode of jaundice. CBC and liver enzymes  Are normal and she is asymptomatic. Reassured that the rash should fade.   Lab Results  Component Value Date   ALT 17 06/15/2017   AST 15 06/15/2017   ALKPHOS 59 06/15/2017   BILITOT 0.7 06/15/2017   Lab Results  Component Value Date   WBC 4.4 06/15/2017   HGB 15.3 (H) 06/15/2017   HCT 45.6 06/15/2017   MCV 91.2 06/15/2017   PLT 150.0 06/15/2017         Relevant Orders   CBC with Differential/Platelet (Completed)      I am having Ms. Armes maintain her liraglutide, Pen Needles, glucose blood, and ACCU-CHEK FASTCLIX LANCETS.  No orders of the defined types were placed in this encounter.   There are no discontinued medications.  Follow-up: Return in about 3 months (around 09/15/2017).   Crecencio Mc, MD

## 2017-06-15 NOTE — Assessment & Plan Note (Addendum)
Recurrent,  Presenting with obstructive jaundice Stent to be removed on Sept 5

## 2017-06-16 ENCOUNTER — Encounter: Payer: Self-pay | Admitting: Internal Medicine

## 2017-06-16 DIAGNOSIS — D692 Other nonthrombocytopenic purpura: Secondary | ICD-10-CM | POA: Insufficient documentation

## 2017-06-16 NOTE — Assessment & Plan Note (Addendum)
Her rash has persisted since her episode of jaundice. CBC and liver enzymes  Are normal and she is asymptomatic. Reassured that the rash should fade.   Lab Results  Component Value Date   ALT 17 06/15/2017   AST 15 06/15/2017   ALKPHOS 59 06/15/2017   BILITOT 0.7 06/15/2017   Lab Results  Component Value Date   WBC 4.4 06/15/2017   HGB 15.3 (H) 06/15/2017   HCT 45.6 06/15/2017   MCV 91.2 06/15/2017   PLT 150.0 06/15/2017

## 2017-06-16 NOTE — Assessment & Plan Note (Signed)
LDL is 118  on Red yeast rice .  No changes today.   She has statin intolerance.  Continue daily aspirin.  Lab Results  Component Value Date   CHOL 167 06/15/2017   HDL 43.40 06/15/2017   LDLCALC 102 (H) 06/15/2017   LDLDIRECT 118.0 02/09/2017   TRIG 112.0 06/15/2017   CHOLHDL 4 06/15/2017   Lab Results  Component Value Date   ALT 17 06/15/2017   AST 15 06/15/2017   ALKPHOS 59 06/15/2017   BILITOT 0.7 06/15/2017

## 2017-06-16 NOTE — Assessment & Plan Note (Signed)
Improved control with addition of victoza .  She is up to date on annual  eye exams and her foot exam is normal. Using RYR due to history of prior statin intolerance.. Recommending use of daily baby aspirin  Lab Results  Component Value Date   HGBA1C 6.5 06/15/2017   Lab Results  Component Value Date   MICROALBUR <0.7 02/09/2017    Lab Results  Component Value Date   CREATININE 0.65 06/15/2017

## 2017-06-17 ENCOUNTER — Encounter: Payer: Self-pay | Admitting: Internal Medicine

## 2017-06-26 ENCOUNTER — Ambulatory Visit
Admission: RE | Admit: 2017-06-26 | Discharge: 2017-06-26 | Disposition: A | Discharge status: Home / Self Care | Payer: 59 | Source: Ambulatory Visit | Attending: Gastroenterology | Admitting: Gastroenterology

## 2017-06-26 ENCOUNTER — Ambulatory Visit: Payer: 59 | Admitting: Certified Registered Nurse Anesthetist

## 2017-06-26 ENCOUNTER — Ambulatory Visit: Payer: 59

## 2017-06-26 ENCOUNTER — Encounter
Admission: RE | Disposition: A | Discharge status: Home / Self Care | Payer: Self-pay | Source: Ambulatory Visit | Attending: Gastroenterology

## 2017-06-26 DIAGNOSIS — Z4689 Encounter for fitting and adjustment of other specified devices: Secondary | ICD-10-CM | POA: Diagnosis not present

## 2017-06-26 DIAGNOSIS — E119 Type 2 diabetes mellitus without complications: Secondary | ICD-10-CM | POA: Insufficient documentation

## 2017-06-26 DIAGNOSIS — K831 Obstruction of bile duct: Secondary | ICD-10-CM | POA: Insufficient documentation

## 2017-06-26 DIAGNOSIS — K838 Other specified diseases of biliary tract: Secondary | ICD-10-CM | POA: Diagnosis not present

## 2017-06-26 DIAGNOSIS — Z4659 Encounter for fitting and adjustment of other gastrointestinal appliance and device: Secondary | ICD-10-CM | POA: Insufficient documentation

## 2017-06-26 DIAGNOSIS — Z794 Long term (current) use of insulin: Secondary | ICD-10-CM | POA: Insufficient documentation

## 2017-06-26 DIAGNOSIS — E669 Obesity, unspecified: Secondary | ICD-10-CM | POA: Diagnosis not present

## 2017-06-26 DIAGNOSIS — I10 Essential (primary) hypertension: Secondary | ICD-10-CM | POA: Diagnosis not present

## 2017-06-26 DIAGNOSIS — Z6836 Body mass index (BMI) 36.0-36.9, adult: Secondary | ICD-10-CM | POA: Diagnosis not present

## 2017-06-26 HISTORY — PX: ERCP: SHX5425

## 2017-06-26 LAB — GLUCOSE, CAPILLARY: Glucose-Capillary: 103 mg/dL — ABNORMAL HIGH (ref 65–99)

## 2017-06-26 SURGERY — ERCP, WITH INTERVENTION IF INDICATED
Anesthesia: General

## 2017-06-26 MED ORDER — PROPOFOL 500 MG/50ML IV EMUL
INTRAVENOUS | Status: DC | PRN
  Administered 2017-06-26: 180 ug/kg/min via INTRAVENOUS

## 2017-06-26 MED ORDER — MIDAZOLAM HCL 2 MG/2ML IJ SOLN
INTRAMUSCULAR | Status: AC
  Filled 2017-06-26: qty 2

## 2017-06-26 MED ORDER — LIDOCAINE HCL (CARDIAC) 20 MG/ML IV SOLN
INTRAVENOUS | Status: DC | PRN
  Administered 2017-06-26: 40 mg via INTRAVENOUS

## 2017-06-26 MED ORDER — PROPOFOL 10 MG/ML IV BOLUS
INTRAVENOUS | Status: DC | PRN
  Administered 2017-06-26: 30 mg via INTRAVENOUS
  Administered 2017-06-26: 40 mg via INTRAVENOUS

## 2017-06-26 MED ORDER — SODIUM CHLORIDE 0.9 % IV SOLN
INTRAVENOUS | Status: DC
  Administered 2017-06-26: 10:00:00 via INTRAVENOUS

## 2017-06-26 MED ORDER — LIDOCAINE HCL (PF) 2 % IJ SOLN
INTRAMUSCULAR | Status: AC
  Filled 2017-06-26: qty 2

## 2017-06-26 MED ORDER — PROPOFOL 10 MG/ML IV BOLUS: INTRAVENOUS | Status: DC | PRN

## 2017-06-26 MED ORDER — PROPOFOL 500 MG/50ML IV EMUL
INTRAVENOUS | Status: AC
  Filled 2017-06-26: qty 50

## 2017-06-26 NOTE — Transfer of Care (Signed)
Immediate Anesthesia Transfer of Care Note  Patient: Kimberly Brady  Procedure(s) Performed: Procedure(s): ENDOSCOPIC RETROGRADE CHOLANGIOPANCREATOGRAPHY (ERCP) (N/A)  Patient Location: PACU  Anesthesia Type:General  Level of Consciousness: awake and alert   Airway & Oxygen Therapy: Patient Spontanous Breathing and Patient connected to nasal cannula oxygen  Post-op Assessment: Report given to RN and Post -op Vital signs reviewed and stable  Post vital signs: Reviewed and stable  Last Vitals:  Vitals:   06/26/17 1012  BP: (!) 154/81  Pulse: 68  Resp: 20  Temp: (!) 35.6 C  SpO2: 98%    Last Pain:  Vitals:   06/26/17 1012  TempSrc: Tympanic         Complications: No apparent anesthesia complications

## 2017-06-26 NOTE — Anesthesia Postprocedure Evaluation (Signed)
Anesthesia Post Note  Patient: Kimberly Brady  Procedure(s) Performed: Procedure(s) (LRB): ENDOSCOPIC RETROGRADE CHOLANGIOPANCREATOGRAPHY (ERCP) (N/A)  Patient location during evaluation: Endoscopy Anesthesia Type: General Level of consciousness: awake and alert Pain management: pain level controlled Vital Signs Assessment: post-procedure vital signs reviewed and stable Respiratory status: spontaneous breathing, nonlabored ventilation, respiratory function stable and patient connected to nasal cannula oxygen Cardiovascular status: blood pressure returned to baseline and stable Postop Assessment: no signs of nausea or vomiting Anesthetic complications: no     Last Vitals:  Vitals:   06/26/17 1130 06/26/17 1140  BP: 126/76 113/69  Pulse: 70   Resp: 12   Temp: (!) 35.7 C   SpO2: 100%     Last Pain:  Vitals:   06/26/17 1130  TempSrc: Tympanic                 Martha Clan

## 2017-06-26 NOTE — Anesthesia Preprocedure Evaluation (Signed)
Anesthesia Evaluation  Patient identified by MRN, date of birth, ID band Patient awake    Reviewed: Allergy & Precautions, H&P , NPO status , Patient's Chart, lab work & pertinent test results, reviewed documented beta blocker date and time   History of Anesthesia Complications Negative for: history of anesthetic complications  Airway Mallampati: II   Neck ROM: full    Dental  (+) Poor Dentition   Pulmonary neg pulmonary ROS,           Cardiovascular hypertension, On Medications (-) angina(-) CAD, (-) Past MI, (-) Cardiac Stents and (-) CABG (-) dysrhythmias (-) Valvular Problems/Murmurs     Neuro/Psych negative neurological ROS  negative psych ROS   GI/Hepatic negative GI ROS, Neg liver ROS,   Endo/Other  diabetes  Renal/GU negative Renal ROS  negative genitourinary   Musculoskeletal   Abdominal   Peds  Hematology negative hematology ROS (+)   Anesthesia Other Findings Past Medical History: No date: Diabetes mellitus     Comment: type 2 No date: Dilated cbd, acquired No date: Obesity (BMI 30-39.9) Past Surgical History: 2009: CHOLECYSTECTOMY     Comment: lapaoscopic 06/25/2015: COLONOSCOPY WITH PROPOFOL N/A     Comment: Procedure: COLONOSCOPY WITH PROPOFOL;                Surgeon: Lucilla Lame, MD;  Location: [place];  Service: Endoscopy;  Laterality:              N/A;  Diabetic - diet controlled 06/19/2016: ERCP N/A     Comment: Procedure: ENDOSCOPIC RETROGRADE               CHOLANGIOPANCREATOGRAPHY (ERCP);  Surgeon:               Lucilla Lame, MD;  Location: [facility] ENDOSCOPY;                Service: Endoscopy;  Laterality: N/A; 09/19/2016: ERCP N/A     Comment: Procedure: ENDOSCOPIC RETROGRADE               CHOLANGIOPANCREATOGRAPHY (ERCP);  Surgeon:               Lucilla Lame, MD;  Location: [facility] ENDOSCOPY;                Service: Endoscopy;  Laterality: N/A; Jan 2013: EYE SURGERY     Comment: serial cataract extreaction, intraocular lense 06/25/2015: POLYPECTOMY     Comment: Procedure: POLYPECTOMY;  Surgeon: Lucilla Lame,              MD;  Location: [place];  Service:               Endoscopy;; 2008: SMALL INTESTINE SURGERY     Comment: Biliary stent placement,  removal 1986: TOTAL ABDOMINAL HYSTERECTOMY W/ BILATERAL SALP*     Comment: for polycystic ovaries,endometriosis BMI    Body Mass Index:  37.91 kg/m     Reproductive/Obstetrics negative OB ROS                             Anesthesia Physical  Anesthesia Plan  ASA: III  Anesthesia Plan: General   Post-op Pain Management:    Induction: Intravenous  PONV Risk Score and Plan: 3 and Propofol infusion  Airway Management Planned: Natural Airway and Nasal Cannula  Additional Equipment:   Intra-op Plan:  Post-operative Plan:   Informed Consent: I have reviewed the patients History and Physical, chart, labs and discussed the procedure including the risks, benefits and alternatives for the proposed anesthesia with the patient or authorized representative who has indicated his/her understanding and acceptance.   Dental Advisory Given  Plan Discussed with: CRNA  Anesthesia Plan Comments:         Anesthesia Quick Evaluation

## 2017-06-26 NOTE — Anesthesia Post-op Follow-up Note (Signed)
Anesthesia QCDR form completed.        

## 2017-06-26 NOTE — H&P (Signed)
Lucilla Lame, MD [address] [place], [place] [address] [phone] Fax : [phone]  Primary Care Physician:  Crecencio Mc, MD Primary Gastroenterologist:  Dr. Allen Norris  Pre-Procedure History & Physical: HPI:  Kataryna Mcquilkin is a 66 y.o. female is here for an ERCP.   Past Medical History:  Diagnosis Date  . Diabetes mellitus    type 2  . Dilated cbd, acquired   . Obesity (BMI 30-39.9)     Past Surgical History:  Procedure Laterality Date  . ABDOMINAL HYSTERECTOMY    . CHOLECYSTECTOMY  2009   lapaoscopic  . COLONOSCOPY WITH PROPOFOL N/A 06/25/2015   Procedure: COLONOSCOPY WITH PROPOFOL;  Surgeon: Lucilla Lame, MD;  Location: [place];  Service: Endoscopy;  Laterality: N/A;  Diabetic - diet controlled  . ERCP N/A 06/19/2016   Procedure: ENDOSCOPIC RETROGRADE CHOLANGIOPANCREATOGRAPHY (ERCP);  Surgeon: Lucilla Lame, MD;  Location: [facility] ENDOSCOPY;  Service: Endoscopy;  Laterality: N/A;  . ERCP N/A 09/19/2016   Procedure: ENDOSCOPIC RETROGRADE CHOLANGIOPANCREATOGRAPHY (ERCP);  Surgeon: Lucilla Lame, MD;  Location[facility] ENDOSCOPY;  Service: Endoscopy;  Laterality: N/A;  . ERCP N/A 12/19/2016   Procedure: ENDOSCOPIC RETROGRADE CHOLANGIOPANCREATOGRAPHY (ERCP);  Surgeon: Lucilla Lame, MD;  Location: [facility] ENDOSCOPY;  Service: Endoscopy;  Laterality: N/A;  . EYE SURGERY  Jan 2013   serial cataract extreaction, intraocular lense  . POLYPECTOMY  06/25/2015   Procedure: POLYPECTOMY;  Surgeon: Lucilla Lame, MD;  Location: [place];  Service: Endoscopy;;  . SMALL INTESTINE SURGERY  2008   Biliary stent placement,  removal  . TOTAL ABDOMINAL HYSTERECTOMY W/ BILATERAL SALPINGOOPHORECTOMY  1986   for polycystic ovaries,endometriosis    Prior to Admission medications   Medication Sig Start Date End Date Taking? Authorizing Provider  liraglutide 18 MG/3ML SOPN 0.6 mg  SubQ  once daily for 1 week;  increase to 1.2 mg once daily;  may increase  to  1.8 mg once daily 03/02/17  Yes Crecencio Mc, MD  ACCU-CHEK FASTCLIX LANCETS MISC TEST ONCE DAILY 06/14/17   Crecencio Mc, MD  glucose blood (TRUE METRIX BLOOD GLUCOSE TEST) test strip TEST ONCE DAILY 03/02/17   Crecencio Mc, MD  Insulin Pen Needle (PEN NEEDLES) 31G X 6 MM MISC For use with victoza /saxenda 03/02/17   Crecencio Mc, MD    Allergies as of 06/06/2017  . (No Known Allergies)    Family History  Problem Relation Age of Onset  . Diabetes Mother        type 2  . Cancer Mother        colon,breast  . Breast cancer Mother 11  . Cancer Father        lung  . Breast cancer Sister 34  . Colon cancer Brother   . Lung cancer Brother   . Breast cancer Maternal Aunt     Social History   Social History  . Marital status: Married    Spouse name: N/A  . Number of children: N/A  . Years of education: N/A   Occupational History  . Not on file.   Social History Main Topics  . Smoking status: Never Smoker  . Smokeless tobacco: Never Used  . Alcohol use No  . Drug use: No  . Sexual activity: Not on file   Other Topics Concern  . Not on file   Social History Narrative   THERE ARE LAB RESULTS IN THE PAPER CHART BUT NOT UPDATED IN COMPUTER SYSTEM AS I WAS  UNABLE TO READ THE RESULTS ACCURATELY...             Review of Systems: See HPI, otherwise negative ROS  Physical Exam: BP (!) 154/81   Pulse 68   Temp (!) 96.1 F (35.6 C) (Tympanic)   Resp 20   Ht 5\' 3"  (1.6 m)   Wt 205 lb (93 kg)   SpO2 98%   BMI 36.31 kg/m  General:   Alert,  pleasant and cooperative in NAD Head:  Normocephalic and atraumatic. Neck:  Supple; no masses or thyromegaly. Lungs:  Clear throughout to auscultation.    Heart:  Regular rate and rhythm. Abdomen:  Soft, nontender and nondistended. Normal bowel sounds, without guarding, and without rebound.   Neurologic:  Alert and  oriented x4;  grossly normal neurologically.  Impression/Plan: Domingo Mend Hagin is here for an ERCP  to be performed for stent removal  Risks, benefits, limitations, and alternatives regarding  ERCP have been reviewed with the patient.  Questions have been answered.  All parties agreeable.   Lucilla Lame, MD  06/26/2017, 11:21 AM

## 2017-06-26 NOTE — Op Note (Addendum)
[facility] Patient Name: Kimberly Brady Procedure Date: 06/26/2017 10:54 AM MRN: [id number] Account #: [id number] Date of Birth: [birth year] Admit Type: Outpatient Age: 66 Room: [facility] ENDO ROOM 4 Gender: Female Note Status: Finalized Procedure:            ERCP Indications:          Biliary stent removal Providers:            Lucilla Lame MD, MD Referring MD:         Deborra Medina, MD (Referring MD) Medicines:            Propofol per Anesthesia Complications:        No immediate complications. Procedure:            Pre-Anesthesia Assessment:                       - Prior to the procedure, a History and Physical was                        performed, and patient medications and allergies were                        reviewed. The patient's tolerance of previous                        anesthesia was also reviewed. The risks and benefits of                        the procedure and the sedation options and risks were                        discussed with the patient. All questions were                        answered, and informed consent was obtained. Prior                        Anticoagulants: The patient has taken no previous                        anticoagulant or antiplatelet agents. ASA Grade                        Assessment: II - A patient with mild systemic disease.                        After reviewing the risks and benefits, the patient was                        deemed in satisfactory condition to undergo the                        procedure.                       After obtaining informed consent, the scope was passed                        under direct vision. Throughout the procedure, the  patient's blood pressure, pulse, and oxygen saturations                        were monitored continuously. The ERCP was introduced                        through the mouth, and used to inject contrast into and   used to inject contrast into the bile duct. The ERCP                        was accomplished without difficulty. The patient                        tolerated the procedure well. Findings:      A biliary stent was visible on the scout film. One covered metal stent       originating in the common bile duct was emerging from the major papilla.       The stent was visibly patent. One stent was removed from the biliary       tree using a Raptor grasping device. The bile duct was deeply cannulated       with the short-nosed traction sphincterotome. Contrast was injected. I       personally interpreted the bile duct images. There was brisk flow of       contrast through the ducts. Image quality was excellent. Contrast       extended to the entire biliary tree. The common hepatic duct contained a       single segmental stenosis. A wire was passed into the biliary tree.       Cells for cytology were obtained by brushing. The biliary tree was swept       with a 15 mm balloon starting at the bifurcation. Sludge was swept from       the duct. Impression:           - One visibly patent stent from the common bile duct                        was seen in the major papilla.                       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                       - One stent was removed from the biliary tree.                       - The biliary tree was swept and sludge was found. Recommendation:       - Watch for pancreatitis, bleeding, perforation, and                        cholangitis. Procedure Code(s):    --- Professional ---                       [phone], Endoscopic retrograde cholangiopancreatography                        (  ERCP); with removal of foreign body(s) or stent(s)                        from biliary/pancreatic duct(s)                       43264, Endoscopic retrograde cholangiopancreatography                        (ERCP); with removal  of calculi/debris from                        biliary/pancreatic duct(s)                       81017, Endoscopic catheterization of the biliary ductal                        system, radiological supervision and interpretation Diagnosis Code(s):    --- Professional ---                       K83.1, Obstruction of bile duct                       Z46.59, Encounter for fitting and adjustment of other                        gastrointestinal appliance and device CPT copyright 2016 American Medical Association. All rights reserved. The codes documented in this report are preliminary and upon coder review may  be revised to meet current compliance requirements. Lucilla Lame MD, MD 06/26/2017 11:28:40 AM This report has been signed electronically. Number of Addenda: 0 Note Initiated On: 06/26/2017 10:54 AM      [facility]

## 2017-06-26 NOTE — Anesthesia Procedure Notes (Signed)
Date/Time: 06/26/2017 10:55 AM Performed by: Johnna Acosta Pre-anesthesia Checklist: Patient identified, Emergency Drugs available, Suction available, Patient being monitored and Timeout performed Patient Re-evaluated:Patient Re-evaluated prior to induction Oxygen Delivery Method: Nasal cannula

## 2017-06-27 ENCOUNTER — Encounter: Payer: Self-pay | Admitting: Gastroenterology

## 2017-06-28 LAB — CYTOLOGY - NON PAP

## 2017-06-29 ENCOUNTER — Telehealth: Payer: Self-pay

## 2017-06-29 NOTE — Telephone Encounter (Signed)
LVM for pt to return my call.

## 2017-06-29 NOTE — Telephone Encounter (Signed)
-----   Message from Lucilla Lame, MD sent at 06/28/2017  8:36 PM EDT ----- Let the patient know the brushings of her bile duct did not show any cancerous cells.

## 2017-07-02 ENCOUNTER — Other Ambulatory Visit: Payer: Self-pay | Admitting: Internal Medicine

## 2017-07-02 NOTE — Telephone Encounter (Signed)
Pt left a vm letting me know she received my vm regarding her results and she is doing ok for now.

## 2017-07-18 NOTE — Telephone Encounter (Signed)
Error

## 2017-07-18 NOTE — Telephone Encounter (Signed)
See order.

## 2017-08-08 ENCOUNTER — Telehealth: Payer: Self-pay

## 2017-08-08 NOTE — Telephone Encounter (Signed)
Pt called today stating that the last couple of weeks since her ERCP stent removal, she has had a lot of fullness, bloating, grumbly stomach and "greasy" diarrhea. Discomfort is on her left side.  It happens a lot after she eats. She doesn't feel this is bile duct related. No clay colored stools, itching or jaundice. Since its been going on a few weeks, she wanted to know what you thought it could be.

## 2017-08-09 NOTE — Telephone Encounter (Signed)
We should check her liver enzymes and her stool for fecal fat to see if she is not digesting her fat which can cause diarrhea and greasy stools.

## 2017-08-10 ENCOUNTER — Other Ambulatory Visit
Admission: RE | Admit: 2017-08-10 | Discharge: 2017-08-10 | Disposition: A | Discharge status: Home / Self Care | Payer: 59 | Source: Ambulatory Visit | Attending: Gastroenterology | Admitting: Gastroenterology

## 2017-08-10 ENCOUNTER — Other Ambulatory Visit: Payer: Self-pay

## 2017-08-10 DIAGNOSIS — R748 Abnormal levels of other serum enzymes: Secondary | ICD-10-CM

## 2017-08-10 LAB — HEPATIC FUNCTION PANEL
ALT: 17 U/L (ref 14–54)
AST: 20 U/L (ref 15–41)
Albumin: 4 g/dL (ref 3.5–5.0)
Alkaline Phosphatase: 64 U/L (ref 38–126)
BILIRUBIN DIRECT: 0.2 mg/dL (ref 0.1–0.5)
BILIRUBIN INDIRECT: 0.8 mg/dL (ref 0.3–0.9)
TOTAL PROTEIN: 7.5 g/dL (ref 6.5–8.1)
Total Bilirubin: 1 mg/dL (ref 0.3–1.2)

## 2017-08-10 NOTE — Telephone Encounter (Signed)
Pt notified to check her liver enzymes and fecal fat. [facility] to have these done.

## 2017-08-13 LAB — FECAL FAT, QUALITATIVE
Fat Qual Neutral, Stl: NORMAL
Fat Qual Total, Stl: NORMAL

## 2017-08-16 ENCOUNTER — Telehealth: Payer: Self-pay

## 2017-08-16 NOTE — Telephone Encounter (Signed)
-----   Message from Lucilla Lame, MD sent at 08/13/2017  3:19 PM EDT ----- The patient know her liver enzymes are completely normal.

## 2017-08-16 NOTE — Telephone Encounter (Signed)
-----   Message from Lucilla Lame, MD sent at 08/13/2017  6:57 PM EDT ----- Let the patient know that the fecal fat was normal

## 2017-08-16 NOTE — Telephone Encounter (Signed)
Pt notified of lab results.   She is still having bloating and pain on her left side. Not all the time. No nausea, vomiting or loss of appetite. Her bowel movements have improved as she was having explosive diarrhea for about 2 weeks. She says she is having some pain in her stomach after she eats. Not bad. She is wondering if there could be something with her pancreas?

## 2017-08-16 NOTE — Telephone Encounter (Signed)
These are not typical of pancreatic problems but we should get a CT scan of her abdomen due to her history of her bile duct stricture.

## 2017-08-17 ENCOUNTER — Other Ambulatory Visit: Payer: Self-pay

## 2017-08-17 DIAGNOSIS — R1084 Generalized abdominal pain: Secondary | ICD-10-CM

## 2017-08-17 NOTE — Telephone Encounter (Signed)
CT scan scheduled at [facility] outpatient imaging in [facility] on Tuesday, Oct 30th @ 9:30am. Pt has been given instructions.

## 2017-08-20 ENCOUNTER — Other Ambulatory Visit: Payer: Self-pay | Admitting: Internal Medicine

## 2017-08-21 ENCOUNTER — Ambulatory Visit
Admission: RE | Admit: 2017-08-21 | Discharge: 2017-08-21 | Disposition: A | Discharge status: Home / Self Care | Payer: 59 | Source: Ambulatory Visit | Attending: Gastroenterology | Admitting: Gastroenterology

## 2017-08-21 ENCOUNTER — Other Ambulatory Visit
Admission: RE | Admit: 2017-08-21 | Discharge: 2017-08-21 | Disposition: A | Discharge status: Home / Self Care | Payer: 59 | Source: Ambulatory Visit | Attending: Gastroenterology | Admitting: Gastroenterology

## 2017-08-21 ENCOUNTER — Other Ambulatory Visit: Payer: Self-pay

## 2017-08-21 DIAGNOSIS — R109 Unspecified abdominal pain: Secondary | ICD-10-CM | POA: Diagnosis not present

## 2017-08-21 DIAGNOSIS — R1084 Generalized abdominal pain: Secondary | ICD-10-CM | POA: Diagnosis not present

## 2017-08-21 DIAGNOSIS — I700 Atherosclerosis of aorta: Secondary | ICD-10-CM | POA: Diagnosis not present

## 2017-08-21 DIAGNOSIS — K831 Obstruction of bile duct: Secondary | ICD-10-CM | POA: Insufficient documentation

## 2017-08-21 DIAGNOSIS — K639 Disease of intestine, unspecified: Secondary | ICD-10-CM | POA: Diagnosis not present

## 2017-08-21 LAB — CREATININE, SERUM: CREATININE: 0.74 mg/dL (ref 0.44–1.00)

## 2017-08-21 MED ORDER — IOPAMIDOL (ISOVUE-300) INJECTION 61%
100.0000 mL | Freq: Once | INTRAVENOUS | Status: AC | PRN
  Administered 2017-08-21: 100 mL via INTRAVENOUS

## 2017-08-23 ENCOUNTER — Telehealth: Payer: Self-pay

## 2017-08-23 ENCOUNTER — Other Ambulatory Visit: Payer: Self-pay

## 2017-08-23 ENCOUNTER — Other Ambulatory Visit
Admission: RE | Admit: 2017-08-23 | Discharge: 2017-08-23 | Disposition: A | Discharge status: Home / Self Care | Payer: 59 | Source: Ambulatory Visit | Attending: Gastroenterology | Admitting: Gastroenterology

## 2017-08-23 DIAGNOSIS — R1084 Generalized abdominal pain: Secondary | ICD-10-CM

## 2017-08-23 LAB — CBC WITH DIFFERENTIAL/PLATELET
Basophils Absolute: 0 10*3/uL (ref 0–0.1)
Basophils Relative: 1 %
Eosinophils Absolute: 0.1 10*3/uL (ref 0–0.7)
Eosinophils Relative: 2 %
HEMATOCRIT: 42.2 % (ref 35.0–47.0)
Hemoglobin: 14.3 g/dL (ref 12.0–16.0)
LYMPHS PCT: 36 %
Lymphs Abs: 1.8 10*3/uL (ref 1.0–3.6)
MCH: 30.3 pg (ref 26.0–34.0)
MCHC: 34 g/dL (ref 32.0–36.0)
MCV: 89 fL (ref 80.0–100.0)
MONO ABS: 0.4 10*3/uL (ref 0.2–0.9)
MONOS PCT: 8 %
NEUTROS ABS: 2.6 10*3/uL (ref 1.4–6.5)
Neutrophils Relative %: 53 %
Platelets: 133 10*3/uL — ABNORMAL LOW (ref 150–440)
RBC: 4.73 MIL/uL (ref 3.80–5.20)
RDW: 12.7 % (ref 11.5–14.5)
WBC: 4.9 10*3/uL (ref 3.6–11.0)

## 2017-08-23 NOTE — Telephone Encounter (Signed)
-----   Message from Lucilla Lame, MD sent at 08/23/2017 10:43 AM EDT ----- The patient had some thickening of the transverse colon which could represent inflammation or lack of distention.  See if the patient is still having pain and also get a CBC to see if her white cell count is elevated.

## 2017-08-23 NOTE — Telephone Encounter (Signed)
Pt notified of CT scan results.

## 2017-08-24 ENCOUNTER — Telehealth: Payer: Self-pay

## 2017-08-24 ENCOUNTER — Ambulatory Visit: Payer: 59

## 2017-08-24 NOTE — Telephone Encounter (Signed)
-----   Message from Lucilla Lame, MD sent at 08/24/2017  9:11 AM EDT ----- Let patient know the CBC was normal except for low platelets which are not of concern.

## 2017-08-24 NOTE — Telephone Encounter (Signed)
Pt notified of CBC results.

## 2017-09-04 ENCOUNTER — Other Ambulatory Visit: Payer: Self-pay | Admitting: Internal Medicine

## 2017-09-21 ENCOUNTER — Ambulatory Visit (INDEPENDENT_AMBULATORY_CARE_PROVIDER_SITE_OTHER): Payer: 59 | Admitting: Internal Medicine

## 2017-09-21 ENCOUNTER — Encounter: Payer: Self-pay | Admitting: Internal Medicine

## 2017-09-21 VITALS — BP 136/76 | HR 82 | Temp 98.3°F | Resp 14 | Ht 63.0 in | Wt 201.0 lb

## 2017-09-21 DIAGNOSIS — E669 Obesity, unspecified: Secondary | ICD-10-CM | POA: Diagnosis not present

## 2017-09-21 DIAGNOSIS — Z0000 Encounter for general adult medical examination without abnormal findings: Secondary | ICD-10-CM | POA: Diagnosis not present

## 2017-09-21 DIAGNOSIS — E1169 Type 2 diabetes mellitus with other specified complication: Secondary | ICD-10-CM | POA: Diagnosis not present

## 2017-09-21 DIAGNOSIS — D696 Thrombocytopenia, unspecified: Secondary | ICD-10-CM

## 2017-09-21 LAB — COMPREHENSIVE METABOLIC PANEL
ALT: 15 U/L (ref 0–35)
AST: 16 U/L (ref 0–37)
Albumin: 4.1 g/dL (ref 3.5–5.2)
Alkaline Phosphatase: 64 U/L (ref 39–117)
BUN: 21 mg/dL (ref 6–23)
CHLORIDE: 103 meq/L (ref 96–112)
CO2: 30 meq/L (ref 19–32)
CREATININE: 0.67 mg/dL (ref 0.40–1.20)
Calcium: 9.4 mg/dL (ref 8.4–10.5)
GFR: 94.12 mL/min (ref >60.00)
Glucose, Bld: 123 mg/dL — ABNORMAL HIGH (ref 70–99)
POTASSIUM: 4.1 meq/L (ref 3.5–5.1)
SODIUM: 138 meq/L (ref 135–145)
Total Bilirubin: 0.8 mg/dL (ref 0.2–1.2)
Total Protein: 7.2 g/dL (ref 6.0–8.3)

## 2017-09-21 LAB — HEMOGLOBIN A1C: HEMOGLOBIN A1C: 6.5 % (ref 4.6–6.5)

## 2017-09-21 LAB — CBC WITH DIFFERENTIAL/PLATELET
BASOS ABS: 0 10*3/uL (ref 0.0–0.1)
BASOS PCT: 0.7 % (ref 0.0–3.0)
Eosinophils Absolute: 0.1 10*3/uL (ref 0.0–0.7)
Eosinophils Relative: 1.7 % (ref 0.0–5.0)
HEMATOCRIT: 41.7 % (ref 36.0–46.0)
Hemoglobin: 14.1 g/dL (ref 12.0–15.0)
LYMPHS PCT: 27.3 % (ref 12.0–46.0)
Lymphs Abs: 1.1 10*3/uL (ref 0.7–4.0)
MCHC: 33.7 g/dL (ref 30.0–36.0)
MCV: 90.4 fl (ref 78.0–100.0)
MONO ABS: 0.3 10*3/uL (ref 0.1–1.0)
Monocytes Relative: 7.1 % (ref 3.0–12.0)
NEUTROS ABS: 2.6 10*3/uL (ref 1.4–7.7)
Neutrophils Relative %: 63.2 % (ref 43.0–77.0)
PLATELETS: 130 10*3/uL — AB (ref 150.0–400.0)
RBC: 4.61 Mil/uL (ref 3.87–5.11)
RDW: 12.6 % (ref 11.5–15.5)
WBC: 4.2 10*3/uL (ref 4.0–10.5)

## 2017-09-21 LAB — LIPID PANEL
CHOL/HDL RATIO: 3
CHOLESTEROL: 147 mg/dL (ref 0–200)
HDL: 46 mg/dL (ref >39.00)
LDL CALC: 74 mg/dL (ref 0–99)
NonHDL: 101.49
Triglycerides: 139 mg/dL (ref 0.0–149.0)
VLDL: 27.8 mg/dL (ref 0.0–40.0)

## 2017-09-21 MED ORDER — PANTOPRAZOLE SODIUM 40 MG PO TBEC: 40.0000 mg | DELAYED_RELEASE_TABLET | Freq: Every day | ORAL | 0 refills | Status: DC

## 2017-09-21 NOTE — Progress Notes (Signed)
Subjective:  Patient ID: Kimberly Brady, female    DOB: [birth year]  Age: 66 y.o. MRN: [id number]  CC: The primary encounter diagnosis was Thrombocytopenia ([place]). Diagnoses of Diabetes mellitus type 2 in obese ([place]), Obesity (BMI 30-39.9), and Visit for preventive health examination were also pertinent to this visit.  HPI Kimberly Brady presents for follow up on multiple issues  Obesity  Down 19 lbs since January.   Has had recurrent  thoracic back pain and abdominal pain on the left side for months acc by gas and bloating.  Fecal fat test was normal but it was done on the day she had a normal BM.  Wohl did CT which was normal except for thickening of the transverse colon.  Infection less likely   CBC  Normal except low platelets. Using Victoza,  Has reduced dose with no change in abd pain.  Denies nausea.  Doesn't want to gain weight by stopping it . Max dose was 1.6 never got to 1.8 mg dose.   Has been taking Motrin or Taking aleve once daily  for back  Pain but symptoms precede use of NSAIDs.    Stools are thin,  And float and occur after every meal.  Acc by gas and bloating .  thinks it may be the victoza  Outpatient Medications Prior to Visit  Medication Sig Dispense Refill  . ACCU-CHEK FASTCLIX LANCETS MISC TEST ONCE DAILY 102 each 0  . ACCU-CHEK GUIDE test strip TEST ONCE DAILY 100 each 1  . Insulin Pen Needle (PEN NEEDLES) 31G X 6 MM MISC For use with victoza /saxenda 100 each 0  . liraglutide (VICTOZA) 18 MG/3ML SOPN INJECT 0.6 MG UNDER THE SKIN ONCE DAILY FOR 1 WEEK; INCREASE TO 1.2 MG ONCE DAILY; MAY INCREASE TO 1.8 MG ONCE DAILY. 9 mL 2   No facility-administered medications prior to visit.     Review of Systems;  Patient denies headache, fevers, malaise, unintentional weight loss, skin rash, eye pain, sinus congestion and sinus pain, sore throat, dysphagia,  hemoptysis , cough, dyspnea, wheezing, chest pain, palpitations, orthopnea, edema, abdominal pain,  nausea, melena, diarrhea, constipation, flank pain, dysuria, hematuria, urinary  Frequency, nocturia, numbness, tingling, seizures,  Focal weakness, Loss of consciousness,  Tremor, insomnia, depression, anxiety, and suicidal ideation.      Objective:  BP 136/76 (BP Location: Right Arm, Patient Position: Sitting, Cuff Size: Normal)   Pulse 82   Temp 98.3 F (36.8 C) (Oral)   Resp 14   Ht 5\' 3"  (1.6 m)   Wt 201 lb (91.2 kg)   SpO2 96%   BMI 35.61 kg/m   BP Readings from Last 3 Encounters:  09/21/17 136/76  06/26/17 113/69  06/15/17 112/74    Wt Readings from Last 3 Encounters:  09/21/17 201 lb (91.2 kg)  06/26/17 205 lb (93 kg)  06/15/17 205 lb 12.8 oz (93.4 kg)    General appearance: alert, cooperative and appears stated age Ears: normal TM's and external ear canals both ears Throat: lips, mucosa, and tongue normal; teeth and gums normal Neck: no adenopathy, no carotid bruit, supple, symmetrical, trachea midline and thyroid not enlarged, symmetric, no tenderness/mass/nodules Back: symmetric, no curvature. ROM normal. No CVA tenderness. Lungs: clear to auscultation bilaterally Heart: regular rate and rhythm, S1, S2 normal, no murmur, click, rub or gallop Abdomen: soft, non-tender; bowel sounds normal; no masses,  no organomegaly Pulses: 2+ and symmetric Skin: Skin color, texture, turgor normal. No rashes or lesions Lymph nodes: Cervical,  supraclavicular, and axillary nodes normal.  Lab Results  Component Value Date   HGBA1C 6.5 09/21/2017   HGBA1C 6.5 06/15/2017   HGBA1C 7.3 02/09/2017    Lab Results  Component Value Date   CREATININE 0.67 09/21/2017   CREATININE 0.74 08/21/2017   CREATININE 0.65 06/15/2017    Lab Results  Component Value Date   WBC 4.2 09/21/2017   HGB 14.1 09/21/2017   HCT 41.7 09/21/2017   PLT 130.0 (L) 09/21/2017   GLUCOSE 123 (H) 09/21/2017   CHOL 147 09/21/2017   TRIG 139.0 09/21/2017   HDL 46.00 09/21/2017   LDLDIRECT 118.0  02/09/2017   LDLCALC 74 09/21/2017   ALT 15 09/21/2017   AST 16 09/21/2017   NA 138 09/21/2017   K 4.1 09/21/2017   CL 103 09/21/2017   CREATININE 0.67 09/21/2017   BUN 21 09/21/2017   CO2 30 09/21/2017   INR 0.85 06/18/2016   HGBA1C 6.5 09/21/2017   MICROALBUR <0.7 02/09/2017    No results found.  Assessment & Plan:   Problem List Items Addressed This Visit    Diabetes mellitus type 2 in obese ([place])    Remains  well-controlled on current medications.  hemoglobin A1c has been consistently at or  less than 7.0 . Patient is taking victoza and has had a significant weight loss accompanied by LUQ pain of uncertain etiology.  CT abd was done  Advised to suspend the victoza and follow the BS  Lab Results  Component Value Date   HGBA1C 6.5 09/21/2017   Lab Results  Component Value Date   MICROALBUR <0.7 02/09/2017         Relevant Orders   Hemoglobin A1c (Completed)   Comprehensive metabolic panel (Completed)   Lipid panel (Completed)   Obesity (BMI 30-39.9)    She has had a significant weight loss since last visit ,  Attributed to [place]  , but accompanied by abdominal pain (LUQ) that started after her biliary stent was removed.  CT of abd and pelvis was ordered y Dr Allen Norris and was normal except for transverse colon wall thickening suggestive of inflammation vs infection.  She has not had diarrhea .  will have her suspend the Victoza and have her return for thyroid studies       Thrombocytopenia ([facility]) - Primary    Mild, found during CBC ordered by Dr Allen Norris .  Persistent.  Not a listed reaction to victoza.  protonix was not prescribed at time of diagnosis.   Lab Results  Component Value Date   WBC 4.2 09/21/2017   HGB 14.1 09/21/2017   HCT 41.7 09/21/2017   MCV 90.4 09/21/2017   PLT 130.0 (L) 09/21/2017         Relevant Orders   CBC with Differential/Platelet (Completed)   Visit for preventive health examination      I am having [name] start on  pantoprazole. I am also having her maintain her Pen Needles, ACCU-CHEK GUIDE, ACCU-CHEK FASTCLIX LANCETS, and liraglutide.  Meds ordered this encounter  Medications  . pantoprazole (PROTONIX) 40 MG tablet    Sig: Take 1 tablet (40 mg total) by mouth daily.    Dispense:  30 tablet    Refill:  0    There are no discontinued medications.  Follow-up: No Follow-up on file.   Crecencio Mc, MD

## 2017-09-21 NOTE — Patient Instructions (Addendum)
For your GI symptoms  1) start a probiotic:   Baruch Goldmann is a good tasting probiotic beverage   Align ,  Morrison star . Flora que and Culturelle  Or the generic equivalent   2) Stop the victoza for 2 weeks  .  follow your blood sugars once or twice daily  Before we start any new medications (Trulicity may be an option , followed by mjanuvia and jardiance are both options)   3) Start taking Protonix(trial of med  for one month ) in early morning 30 minutes prior to eating ( at least )       Ask your  PT friend Ronalee Belts to address your back pain with core strengthening exercises.

## 2017-09-23 ENCOUNTER — Encounter: Payer: Self-pay | Admitting: Internal Medicine

## 2017-09-23 DIAGNOSIS — D696 Thrombocytopenia, unspecified: Secondary | ICD-10-CM | POA: Insufficient documentation

## 2017-09-23 NOTE — Assessment & Plan Note (Signed)
Remains  well-controlled on current medications.  hemoglobin A1c has been consistently at or  less than 7.0 . Patient is taking victoza and has had a significant weight loss accompanied by LUQ pain of uncertain etiology.  CT abd was done  Advised to suspend the victoza and follow the BS  Lab Results  Component Value Date   HGBA1C 6.5 09/21/2017   Lab Results  Component Value Date   MICROALBUR <0.7 02/09/2017

## 2017-09-23 NOTE — Assessment & Plan Note (Addendum)
Mild, found during CBC ordered by Dr Allen Norris .  Persistent.  Not a listed reaction to victoza.  protonix was not prescribed at time of diagnosis.   Lab Results  Component Value Date   WBC 4.2 09/21/2017   HGB 14.1 09/21/2017   HCT 41.7 09/21/2017   MCV 90.4 09/21/2017   PLT 130.0 (L) 09/21/2017

## 2017-09-23 NOTE — Assessment & Plan Note (Signed)
She has had a significant weight loss since last visit ,  Attributed to [place]  , but accompanied by abdominal pain (LUQ) that started after her biliary stent was removed.  CT of abd and pelvis was ordered y Dr Allen Norris and was normal except for transverse colon wall thickening suggestive of inflammation vs infection.  She has not had diarrhea .  will have her suspend the Victoza and have her return for thyroid studies

## 2017-10-04 ENCOUNTER — Encounter: Payer: Self-pay | Admitting: Internal Medicine

## 2017-10-04 DIAGNOSIS — R3911 Hesitancy of micturition: Secondary | ICD-10-CM

## 2017-10-04 DIAGNOSIS — M545 Low back pain, unspecified: Secondary | ICD-10-CM

## 2017-10-05 ENCOUNTER — Other Ambulatory Visit: Payer: Self-pay | Admitting: Internal Medicine

## 2017-10-05 DIAGNOSIS — G8929 Other chronic pain: Secondary | ICD-10-CM

## 2017-10-05 DIAGNOSIS — R110 Nausea: Secondary | ICD-10-CM

## 2017-10-05 DIAGNOSIS — R3911 Hesitancy of micturition: Secondary | ICD-10-CM

## 2017-10-05 DIAGNOSIS — M546 Pain in thoracic spine: Principal | ICD-10-CM

## 2017-10-09 NOTE — Progress Notes (Signed)
Pt calling to check to see if she can have these labs done in [place]. Please advise.

## 2017-10-10 NOTE — Telephone Encounter (Signed)
Spoke with pt to let her know that the orders have been put in so she can collect a urine sample at [facility] in [place]. Pt stated that she would not be able to do it this week because she has family coming into town for the holidays but she will go next week when she returns to work.

## 2017-10-15 ENCOUNTER — Encounter: Payer: Self-pay | Admitting: Internal Medicine

## 2017-10-15 ENCOUNTER — Other Ambulatory Visit
Admission: RE | Admit: 2017-10-15 | Discharge: 2017-10-15 | Disposition: A | Discharge status: Home / Self Care | Payer: 59 | Source: Ambulatory Visit | Attending: Internal Medicine | Admitting: Internal Medicine

## 2017-10-15 DIAGNOSIS — R3911 Hesitancy of micturition: Secondary | ICD-10-CM | POA: Insufficient documentation

## 2017-10-15 DIAGNOSIS — R110 Nausea: Secondary | ICD-10-CM

## 2017-10-15 LAB — URINALYSIS, COMPLETE (UACMP) WITH MICROSCOPIC
BACTERIA UA: NONE SEEN
Bilirubin Urine: NEGATIVE
Glucose, UA: NEGATIVE mg/dL
HGB URINE DIPSTICK: NEGATIVE
Ketones, ur: NEGATIVE mg/dL
Leukocytes, UA: NEGATIVE
Nitrite: NEGATIVE
Protein, ur: NEGATIVE mg/dL
RBC / HPF: NONE SEEN RBC/hpf (ref 0–5)
SPECIFIC GRAVITY, URINE: 1.025 (ref 1.005–1.030)
pH: 5.5 (ref 5.0–8.0)

## 2017-10-15 NOTE — Addendum Note (Signed)
Addended by: Janece Canterbury on: 10/15/2017 09:26 AM   Modules accepted: Orders

## 2017-10-15 NOTE — Telephone Encounter (Signed)
Urine orders were already placed & collected

## 2017-10-15 NOTE — Addendum Note (Signed)
Addended by: Janece Canterbury on: 10/15/2017 09:27 AM   Modules accepted: Orders

## 2017-10-16 LAB — URINE CULTURE: Culture: NO GROWTH

## 2017-10-19 ENCOUNTER — Ambulatory Visit: Payer: Self-pay

## 2017-10-19 ENCOUNTER — Encounter: Payer: Self-pay | Admitting: Internal Medicine

## 2017-10-19 NOTE — Telephone Encounter (Signed)
Patient called in with c/o "Left flank pain, near upper back around my waist." She says this pain has been there for a few weeks and she had urine tests done that were negative, but she is still having symptoms and would like to see Dr. Derrel Nip to make sure it is not her kidneys. She denies severe pain at this time, but reports it being sharp off and on sometimes, but mostly mild to moderate pain.  When asked about her urination, she says "it doesn't burn, but I have to sit and squeeze it out because I know it's there. I am urinating, but not as often as I think I should with what I drink." I asked was she having fevers, she stated "I haven't checked it because I don't have a thermometer, but I don't feel like I have one. I do get chills off and on, but I don't feel like it's fever related." Advised her to get a thermometer so she can check to see if she is feverish with the chills, verbalized understanding.  Per protocol, patient is to be seen within 3 days by PCP, no available appointments with Dr. Derrel Nip and patient refuses to be seen by another provider in the office, I contacted Juliann Pulse, the flow manager and she was able to schedule an appointment for the patient on 10/24/17 at 1130 am, patient agrees to this appointment, care advice given, verbalized understanding. Patient advised to go to the ED or UC if symptoms worsen over the weekend, verbalized understanding.    Reason for Disposition . [1] MILD pain (i.e., scale 1-3; does not interfere with normal activities) AND [2] present > 3 days  Answer Assessment - Initial Assessment Questions 1. LOCATION: "Where does it hurt?" (e.g., left, right)     Left upper back, around waist 2. ONSET: "When did the pain start?"     Past few weeks 3. SEVERITY: "How bad is the pain?" (e.g., Scale 1-10; mild, moderate, or severe)   - MILD (1-3): doesn't interfere with normal activities    - MODERATE (4-7): interferes with normal activities or awakens from sleep    -  SEVERE (8-10): excruciating pain and patient unable to do normal activities (stays in bed)       Mild to moderate 4. PATTERN: "Does the pain come and go, or is it constant?"      Pain comes and goes 5. CAUSE: "What do you think is causing the pain?"     Unknown 6. OTHER SYMPTOMS:  "Do you have any other symptoms?" (e.g., fever, abdominal pain, vomiting, leg weakness, burning with urination, blood in urine)     Urine small amounts, sit and squeeze it out, no burning, no abdominal pain, nauseated 7. PREGNANCY:  "Is there any chance you are pregnant?" "When was your last menstrual period?"     N/A  Protocols used: FLANK PAIN-A-AH

## 2017-10-19 NOTE — Telephone Encounter (Signed)
Patient has been scheduled

## 2017-10-24 ENCOUNTER — Ambulatory Visit
Admission: RE | Admit: 2017-10-24 | Discharge: 2017-10-24 | Disposition: A | Discharge status: Home / Self Care | Payer: 59 | Source: Ambulatory Visit | Attending: Internal Medicine | Admitting: Internal Medicine

## 2017-10-24 ENCOUNTER — Telehealth: Payer: Self-pay | Admitting: Internal Medicine

## 2017-10-24 ENCOUNTER — Encounter: Payer: Self-pay | Admitting: Internal Medicine

## 2017-10-24 ENCOUNTER — Ambulatory Visit (INDEPENDENT_AMBULATORY_CARE_PROVIDER_SITE_OTHER): Payer: 59 | Admitting: Internal Medicine

## 2017-10-24 VITALS — BP 138/86 | HR 78 | Temp 98.2°F | Resp 14 | Ht 63.0 in | Wt 196.6 lb

## 2017-10-24 DIAGNOSIS — R110 Nausea: Secondary | ICD-10-CM | POA: Diagnosis not present

## 2017-10-24 DIAGNOSIS — D696 Thrombocytopenia, unspecified: Secondary | ICD-10-CM | POA: Diagnosis not present

## 2017-10-24 DIAGNOSIS — M545 Low back pain, unspecified: Secondary | ICD-10-CM

## 2017-10-24 DIAGNOSIS — R1012 Left upper quadrant pain: Secondary | ICD-10-CM | POA: Diagnosis not present

## 2017-10-24 DIAGNOSIS — R3911 Hesitancy of micturition: Secondary | ICD-10-CM

## 2017-10-24 DIAGNOSIS — R197 Diarrhea, unspecified: Secondary | ICD-10-CM | POA: Diagnosis not present

## 2017-10-24 DIAGNOSIS — R1084 Generalized abdominal pain: Secondary | ICD-10-CM

## 2017-10-24 DIAGNOSIS — R109 Unspecified abdominal pain: Secondary | ICD-10-CM | POA: Diagnosis not present

## 2017-10-24 LAB — CBC WITH DIFFERENTIAL/PLATELET
BASOS PCT: 0.8 % (ref 0.0–3.0)
Basophils Absolute: 0 10*3/uL (ref 0.0–0.1)
EOS ABS: 0.1 10*3/uL (ref 0.0–0.7)
Eosinophils Relative: 1.3 % (ref 0.0–5.0)
HCT: 43.3 % (ref 36.0–46.0)
Hemoglobin: 14.7 g/dL (ref 12.0–15.0)
Lymphocytes Relative: 28.2 % (ref 12.0–46.0)
Lymphs Abs: 1.2 10*3/uL (ref 0.7–4.0)
MCHC: 33.9 g/dL (ref 30.0–36.0)
MCV: 89.9 fl (ref 78.0–100.0)
MONO ABS: 0.3 10*3/uL (ref 0.1–1.0)
Monocytes Relative: 7 % (ref 3.0–12.0)
NEUTROS ABS: 2.7 10*3/uL (ref 1.4–7.7)
NEUTROS PCT: 62.7 % (ref 43.0–77.0)
PLATELETS: 121 10*3/uL — AB (ref 150.0–400.0)
RBC: 4.82 Mil/uL (ref 3.87–5.11)
RDW: 12.9 % (ref 11.5–15.5)
WBC: 4.3 10*3/uL (ref 4.0–10.5)

## 2017-10-24 LAB — COMPREHENSIVE METABOLIC PANEL
ALT: 17 U/L (ref 0–35)
AST: 14 U/L (ref 0–37)
Albumin: 4.3 g/dL (ref 3.5–5.2)
Alkaline Phosphatase: 75 U/L (ref 39–117)
BUN: 13 mg/dL (ref 6–23)
CALCIUM: 9.4 mg/dL (ref 8.4–10.5)
CHLORIDE: 101 meq/L (ref 96–112)
CO2: 29 meq/L (ref 19–32)
CREATININE: 0.67 mg/dL (ref 0.40–1.20)
GFR: 94.09 mL/min (ref >60.00)
Glucose, Bld: 132 mg/dL — ABNORMAL HIGH (ref 70–99)
Potassium: 4 mEq/L (ref 3.5–5.1)
SODIUM: 138 meq/L (ref 135–145)
Total Bilirubin: 0.9 mg/dL (ref 0.2–1.2)
Total Protein: 7.5 g/dL (ref 6.0–8.3)

## 2017-10-24 LAB — SEDIMENTATION RATE: Sed Rate: 16 mm/hr (ref 0–30)

## 2017-10-24 MED ORDER — TAMSULOSIN HCL 0.4 MG PO CAPS: 0.4000 mg | ORAL_CAPSULE | Freq: Every day | ORAL | 3 refills | Status: DC

## 2017-10-24 MED ORDER — IOPAMIDOL (ISOVUE-300) INJECTION 61%
100.0000 mL | Freq: Once | INTRAVENOUS | Status: AC | PRN
  Administered 2017-10-24: 100 mL via INTRAVENOUS

## 2017-10-24 NOTE — Telephone Encounter (Signed)
[facility] radiology called with CT result impression copied below.   IMPRESSION: Soft tissue changes surrounding the celiac axis and porta hepatis extending from the region of the pancreatic head. Attenuation of the portal vein is noted as well as intrahepatic ductal dilatation. Although these changes could be related to scarring from prior surgery and localized inflammatory change the possibility of neoplasm deserves consideration and further evaluation by means of MRI is recommended.  Hypodense region in the medial segment of the left lobe of the liver better characterized on the current exam than on previous studies. This likely represents focal fatty infiltration but can also be evaluated on MRI.  These results will be called to the ordering clinician or representative by the Radiologist Assistant, and communication documented in the PACS or zVision Dashboard.

## 2017-10-24 NOTE — Patient Instructions (Addendum)
Trial of flomax for the urinary retention ,  Once daily  CT scan of abd and pelvis is necessary to rule out abscess ,  Inflammation of colon

## 2017-10-24 NOTE — Telephone Encounter (Signed)
Please let patient know that there is some swelling around the head of the pancreas  That could be related to the biliary stent  Removal but they cannot be sure it is not a tumor  without an MRI.  I will order the MRI unless she wants to see Dr Allen Norris first . If she wants to go ahead with the MRI first,  I  will let him know what we are doing  But I want her to see him afterward

## 2017-10-24 NOTE — Progress Notes (Signed)
Subjective:  Patient ID: Kimberly Brady, female    DOB: 1951/07/29  Age: 67 y.o. MRN: [id number]  CC: The primary encounter diagnosis was Nausea in adult. Diagnoses of Generalized abdominal pain, Acute left-sided low back pain without sciatica, Thrombocytopenia ([place]), Left upper quadrant pain, Nausea, and Urinary hesitancy were also pertinent to this visit.  HPI Kimberly Brady presents for recurrent nausea,  Subjective fevers, and left sided back pain that has been present since late September , after removal of a biliary stent.   UA and culture were normal on Dec 24th when she reported urinary hesitancy.   Stools have been loose intermittently for weeks  alternating wth solid stools ,  Some foods not tolerated (salads  Pass through undigested ) and  Increased gas ,  And nausea.   Has occasional abdominal pain alleviated by having a stool.   Dr Allen Norris has no follow up planned    Outpatient Medications Prior to Visit  Medication Sig Dispense Refill  . ACCU-CHEK FASTCLIX LANCETS MISC TEST ONCE DAILY 102 each 0  . ACCU-CHEK GUIDE test strip TEST ONCE DAILY 100 each 1  . pantoprazole (PROTONIX) 40 MG tablet Take 1 tablet (40 mg total) by mouth daily. 30 tablet 0  . Insulin Pen Needle (PEN NEEDLES) 31G X 6 MM MISC For use with victoza /saxenda (Patient not taking: Reported on 10/24/2017) 100 each 0  . liraglutide (VICTOZA) 18 MG/3ML SOPN INJECT 0.6 MG UNDER THE SKIN ONCE DAILY FOR 1 WEEK; INCREASE TO 1.2 MG ONCE DAILY; MAY INCREASE TO 1.8 MG ONCE DAILY. (Patient not taking: Reported on 10/24/2017) 9 mL 2   No facility-administered medications prior to visit.     Review of Systems;  Patient denies headache, fevers, malaise, unintentional weight loss, skin rash, eye pain, sinus congestion and sinus pain, sore throat, dysphagia,  hemoptysis , cough, dyspnea, wheezing, chest pain, palpitations, orthopnea, edema,, melena, diarrhea, constipation, flank pain, dysuria, hematuria, urinary   Frequency, nocturia, numbness, tingling, seizures,  Focal weakness, Loss of consciousness,  Tremor, insomnia, depression, anxiety, and suicidal ideation.      Objective:  BP 138/86 (BP Location: Left Arm, Patient Position: Sitting, Cuff Size: Normal)   Pulse 78   Temp 98.2 F (36.8 C) (Oral)   Resp 14   Ht 5\' 3"  (1.6 m)   Wt 196 lb 9.6 oz (89.2 kg)   SpO2 98%   BMI 34.83 kg/m   BP Readings from Last 3 Encounters:  10/24/17 138/86  09/21/17 136/76  06/26/17 113/69    Wt Readings from Last 3 Encounters:  10/24/17 196 lb 9.6 oz (89.2 kg)  09/21/17 201 lb (91.2 kg)  06/26/17 205 lb (93 kg)    General appearance: alert, cooperative and appears stated age Ears: normal TM's and external ear canals both ears Throat: lips, mucosa, and tongue normal; teeth and gums normal Neck: no adenopathy, no carotid bruit, supple, symmetrical, trachea midline and thyroid not enlarged, symmetric, no tenderness/mass/nodules Back: symmetric, no curvature. ROM normal. No CVA tenderness. Lungs: clear to auscultation bilaterally Heart: regular rate and rhythm, S1, S2 normal, no murmur, click, rub or gallop Abdomen: soft, non-tender; bowel sounds normal; no masses,  no organomegaly Pulses: 2+ and symmetric Skin: Skin color, texture, turgor normal. No rashes or lesions Lymph nodes: Cervical, supraclavicular, and axillary nodes normal.  Lab Results  Component Value Date   HGBA1C 6.5 09/21/2017   HGBA1C 6.5 06/15/2017   HGBA1C 7.3 02/09/2017    Lab Results  Component Value  Date   CREATININE 0.67 10/24/2017   CREATININE 0.67 09/21/2017   CREATININE 0.74 08/21/2017    Lab Results  Component Value Date   WBC 4.3 10/24/2017   HGB 14.7 10/24/2017   HCT 43.3 10/24/2017   PLT 121.0 (L) 10/24/2017   GLUCOSE 132 (H) 10/24/2017   CHOL 147 09/21/2017   TRIG 139.0 09/21/2017   HDL 46.00 09/21/2017   LDLDIRECT 118.0 02/09/2017   LDLCALC 74 09/21/2017   ALT 17 10/24/2017   AST 14 10/24/2017   NA  138 10/24/2017   K 4.0 10/24/2017   CL 101 10/24/2017   CREATININE 0.67 10/24/2017   BUN 13 10/24/2017   CO2 29 10/24/2017   INR 0.85 06/18/2016   HGBA1C 6.5 09/21/2017   MICROALBUR <0.7 02/09/2017    No results found.  Assessment & Plan:   Problem List Items Addressed This Visit    Nausea    With left sided back pain,  Present intermittently since September,  Now with episode of subjective fevers and chills. CT abd and pelvis ordered to rule out abscess.  Mass near the head of pancreas noted ,  MRI and urgent GI follow up ordered.   Lab Results  Component Value Date   ESRSEDRATE 16 10/24/2017   Lab Results  Component Value Date   WBC 4.3 10/24/2017   HGB 14.7 10/24/2017   HCT 43.3 10/24/2017   MCV 89.9 10/24/2017   PLT 121.0 (L) 10/24/2017         Thrombocytopenia (HCC)    Mild but persistent on repeat CC.  Etiology unclear; may be due to hematoma secondary to biliary stent removal.  Will follow      Relevant Orders   CBC with Differential/Platelet (Completed)   CBC with Differential/Platelet   Pathologist smear review   Urinary hesitancy    Trial of flomax        Other Visit Diagnoses    Nausea in adult    -  Primary   Relevant Orders   Comprehensive metabolic panel (Completed)   CT Abdomen Pelvis W Contrast (Completed)   Generalized abdominal pain       Relevant Orders   CT Abdomen Pelvis W Contrast (Completed)   Acute left-sided low back pain without sciatica       Relevant Orders   Sedimentation rate (Completed)   CT Abdomen Pelvis W Contrast (Completed)   Left upper quadrant pain       Relevant Orders   CT Abdomen Pelvis W Contrast (Completed)      I am having Kimberly Brady start on tamsulosin. I am also having her maintain her Pen Needles, ACCU-CHEK GUIDE, ACCU-CHEK FASTCLIX LANCETS, liraglutide, and pantoprazole.  Meds ordered this encounter  Medications  . tamsulosin (FLOMAX) 0.4 MG CAPS capsule    Sig: Take 1 capsule (0.4 mg total) by  mouth daily.    Dispense:  30 capsule    Refill:  3    There are no discontinued medications.  Follow-up: No Follow-up on file.   Crecencio Mc, MD

## 2017-10-25 ENCOUNTER — Encounter: Payer: Self-pay | Admitting: Internal Medicine

## 2017-10-25 ENCOUNTER — Other Ambulatory Visit: Payer: Self-pay | Admitting: Internal Medicine

## 2017-10-25 ENCOUNTER — Telehealth: Payer: Self-pay | Admitting: Gastroenterology

## 2017-10-25 DIAGNOSIS — R3911 Hesitancy of micturition: Secondary | ICD-10-CM | POA: Insufficient documentation

## 2017-10-25 DIAGNOSIS — R110 Nausea: Secondary | ICD-10-CM | POA: Insufficient documentation

## 2017-10-25 DIAGNOSIS — K8689 Other specified diseases of pancreas: Secondary | ICD-10-CM

## 2017-10-25 NOTE — Telephone Encounter (Signed)
Dr Allen Norris requested an MRi be done  STAT,  It has been ordered

## 2017-10-25 NOTE — Assessment & Plan Note (Signed)
With left sided back pain,  Present intermittently since September,  Now with episode of subjective fevers and chills. CT abd and pelvis ordered to rule out abscess.  Mass near the head of pancreas noted ,  MRI and urgent GI follow up ordered.   Lab Results  Component Value Date   ESRSEDRATE 16 10/24/2017   Lab Results  Component Value Date   WBC 4.3 10/24/2017   HGB 14.7 10/24/2017   HCT 43.3 10/24/2017   MCV 89.9 10/24/2017   PLT 121.0 (L) 10/24/2017

## 2017-10-25 NOTE — Assessment & Plan Note (Signed)
Trial of flomax

## 2017-10-25 NOTE — Telephone Encounter (Signed)
Patient aware.

## 2017-10-25 NOTE — Telephone Encounter (Signed)
Pt has been scheduled for a follow up appt with Dr. Wohl.  

## 2017-10-25 NOTE — Progress Notes (Signed)
stat MRI abdomen ordered

## 2017-10-25 NOTE — Assessment & Plan Note (Addendum)
Mild but persistent on repeat CC.  Etiology unclear; may be due to hematoma secondary to biliary stent removal.  Will follow

## 2017-10-25 NOTE — Telephone Encounter (Signed)
Patient would like to see Dr. Allen Norris  Before having the MRI, Patient stated she will call Dr. Allen Norris office and if she cannot get a quick appt, she will call our office back.

## 2017-10-25 NOTE — Telephone Encounter (Signed)
Patient called and wants to talk to you. Had a CT of abd/pelv and noted swelling of the head of the pancrease.

## 2017-10-30 ENCOUNTER — Ambulatory Visit
Admission: RE | Admit: 2017-10-30 | Discharge: 2017-10-30 | Disposition: A | Discharge status: Home / Self Care | Payer: 59 | Source: Ambulatory Visit | Attending: Internal Medicine | Admitting: Internal Medicine

## 2017-10-30 DIAGNOSIS — K8689 Other specified diseases of pancreas: Secondary | ICD-10-CM

## 2017-10-30 DIAGNOSIS — I81 Portal vein thrombosis: Secondary | ICD-10-CM | POA: Insufficient documentation

## 2017-10-30 DIAGNOSIS — K869 Disease of pancreas, unspecified: Secondary | ICD-10-CM | POA: Insufficient documentation

## 2017-10-30 MED ORDER — GADOBENATE DIMEGLUMINE 529 MG/ML IV SOLN
18.0000 mL | Freq: Once | INTRAVENOUS | Status: AC | PRN
  Administered 2017-10-30: 18 mL via INTRAVENOUS

## 2017-11-01 ENCOUNTER — Ambulatory Visit: Payer: 59 | Admitting: Gastroenterology

## 2017-11-01 ENCOUNTER — Other Ambulatory Visit
Admission: RE | Admit: 2017-11-01 | Discharge: 2017-11-01 | Disposition: A | Discharge status: Home / Self Care | Payer: 59 | Source: Ambulatory Visit | Attending: Gastroenterology | Admitting: Gastroenterology

## 2017-11-01 ENCOUNTER — Encounter: Payer: Self-pay | Admitting: Gastroenterology

## 2017-11-01 VITALS — BP 147/78 | HR 72 | Ht 63.0 in | Wt 198.0 lb

## 2017-11-01 DIAGNOSIS — K869 Disease of pancreas, unspecified: Secondary | ICD-10-CM | POA: Diagnosis not present

## 2017-11-01 DIAGNOSIS — K8689 Other specified diseases of pancreas: Secondary | ICD-10-CM

## 2017-11-01 NOTE — Progress Notes (Signed)
Primary Care Physician: Crecencio Mc, MD  Primary Gastroenterologist:  Dr. Lucilla Lame  No chief complaint on file.   HPI: Kimberly Brady is a 67 y.o. female here for follow-up after having an abnormal CT scan and MRI. The patient had a CT scan in October that showed some postcholecystectomy bile duct dilatation. The patient then had a repeat CT scan on January of this year. This showed an area of concern around the porta hepatis and an MRI was ordered. The MRI was suggestive of possible pancreatic cancer versus extra biliary cholangiocarcinoma. The patient's most recent lab work did not show any abnormal liver enzymes or anemia. 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  The patient also had a enlarged lymph node in the porta hepatis consistent with metastases.  Current Outpatient Medications  Medication Sig Dispense Refill  . ACCU-CHEK FASTCLIX LANCETS MISC TEST ONCE DAILY 102 each 0  . ACCU-CHEK GUIDE test strip TEST ONCE DAILY 100 each 1  . Insulin Pen Needle (PEN NEEDLES) 31G X 6 MM MISC For use with victoza /saxenda (Patient not taking: Reported on 10/24/2017) 100 each 0  . liraglutide (VICTOZA) 18 MG/3ML SOPN INJECT 0.6 MG UNDER THE SKIN ONCE DAILY FOR 1 WEEK; INCREASE TO 1.2 MG ONCE DAILY; MAY INCREASE TO 1.8 MG ONCE DAILY. (Patient not taking: Reported on 10/24/2017) 9 mL 2  . pantoprazole (PROTONIX) 40 MG tablet Take 1 tablet (40 mg total) by mouth daily. 30 tablet 0  . tamsulosin (FLOMAX) 0.4 MG CAPS capsule Take 1 capsule (0.4 mg total) by mouth daily. 30 capsule 3   No current facility-administered medications for this visit.     Allergies as of 11/01/2017  . (No Known Allergies)    ROS:  General: Negative for anorexia, weight loss, fever, chills, fatigue, weakness. ENT: Negative for hoarseness, difficulty swallowing , nasal congestion. CV: Negative for chest pain, angina,  palpitations, dyspnea on exertion, peripheral edema.  Respiratory: Negative for dyspnea at rest, dyspnea on exertion, cough, sputum, wheezing.  GI: See history of present illness. GU:  Negative for dysuria, hematuria, urinary incontinence, urinary frequency, nocturnal urination.  Endo: Negative for unusual weight change.    Physical Examination:   There were no vitals taken for this visit.  General: Well-nourished, well-developed in no acute distress.  Eyes: No icterus. Conjunctivae pink. Extremities: No lower extremity edema. No clubbing or deformities. Neuro: Alert and oriented x 3.  Grossly intact. Skin: Warm and dry, no jaundice.   Psych: Alert and cooperative, normal mood and affect.  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 Follow-up CT. Upper back pain. EXAM: MRI ABDOMEN WITHOUT AND WITH CONTRAST TECHNIQUE: Multiplanar multisequence MR imaging of the abdomen was performed both before and after the administration of intravenous contrast. CONTRAST:  62mL MULTIHANCE GADOBENATE DIMEGLUMINE 529 MG/ML IV SOLN COMPARISON:  CT abdomen/pelvis dated 10/24/2017. FINDINGS: Lower chest: Lung bases are clear. Hepatobiliary: Liver is within normal limits. Focal fat along the falciform ligament (series 4/image 15). No suspicious/enhancing hepatic lesions. Status post cholecystectomy. Mild left intrahepatic ductal dilatation. Pancreas: 2.8 x 3.7 cm peripherally enhancing soft tissue mass along the celiac axis (series 17/image 29), posterior to the pancreas, and adjacent to the common duct. Differential considerations include exophytic pancreatic cancer versus extrahepatic cholangiocarcinoma. Pancreas is otherwise within normal limits. No pancreatic ductal dilatation.  No parenchymal atrophy. Spleen:  Within normal limits. Adrenals/Urinary Tract:  Adrenal glands are within limits. Kidneys are within limits.  No  hydronephrosis. Stomach/Bowel: Stomach is within normal limits. Visualized bowel is unremarkable. Vascular/Lymphatic: Tumor described above encases the celiac axis and also involves the right aspect of the SMA (series 17/image 32). Portal vein thrombosis with collateralization. 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 Other:  No abdominal ascites. Musculoskeletal: No focal osseous lesions. IMPRESSION: 3.7 cm mass along the posterior aspect of the pancreas and adjacent to the common duct. Differential considerations include exophytic pancreatic cancer versus extrahepatic cholangiocarcinoma. 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 Associated portal vein thrombosis with collateralization. Secondary left intrahepatic ductal dilatation. 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 Focal fat in the liver. No findings suspicious for hepatic metastases. Electronically Signed   By: Julian Hy M.D.   On: 10/30/2017 12:50   Ct Abdomen Pelvis W Contrast  Result Date: 10/24/2017 CLINICAL DATA:  Abdominal pain and diarrhea EXAM: CT ABDOMEN AND PELVIS WITH CONTRAST TECHNIQUE: Multidetector CT imaging of the abdomen and pelvis was performed using the standard protocol following bolus administration of intravenous contrast. CONTRAST:  13mL ISOVUE-300 IOPAMIDOL (ISOVUE-300) INJECTION 61% COMPARISON:  08/21/2017 FINDINGS: Lower chest: No acute abnormality. Calcified mediastinal lymph nodes are noted consistent with prior granulomatous disease. Hepatobiliary: Gallbladder has been surgically removed. Biliary ductal dilatation is noted consistent with the postcholecystectomy state. Hypodensity is noted in the medial segment of the left lobe of the liver adjacent to the falciform ligament which may represent some focal fatty infiltration but is new from the prior examination. Air is noted within the common bile duct consistent with prior  stent placement and sphincterotomy. Pancreas: The body and tail of the pancreas are within normal limits. In the region of the head of the pancreas, some considerable soft tissue density is noted which attenuates the portal vein and surrounds the celiac axis extending into the porta hepatis. Spleen: Normal in size without focal abnormality. Adrenals/Urinary Tract: Adrenal glands are unremarkable. Kidneys are normal, without renal calculi, focal lesion, or hydronephrosis. Bladder is unremarkable. Stomach/Bowel: The appendix is not well visualized. No inflammatory changes to suggest appendicitis are seen. No obstructive or inflammatory changes are noted. Vascular/Lymphatic: Aortic atherosclerosis. No enlarged abdominal or pelvic lymph nodes. Reproductive: Status post hysterectomy. No adnexal masses. Other: No abdominal wall hernia or abnormality. No abdominopelvic ascites. Musculoskeletal: Degenerative changes of the lumbar spine are noted. IMPRESSION: Soft tissue changes surrounding the celiac axis and porta hepatis extending from the region of the pancreatic head. Attenuation of the portal vein is noted as well as intrahepatic ductal dilatation. Although these changes could be related to scarring from prior surgery and localized inflammatory change the possibility of neoplasm deserves consideration and further evaluation by means of MRI is recommended. Hypodense region in the medial segment of the left lobe of the liver better characterized on the current exam than on previous studies. This likely represents focal fatty infiltration but can also be evaluated on MRI. These results will be called to the ordering clinician or representative by the Radiologist Assistant, and communication documented in the PACS or zVision Dashboard. Electronically Signed   By: Inez Catalina M.D.   On: 10/24/2017 15:07    Assessment and Plan:   Kimberly Brady is a 67 y.o. y/o female with an abnormal consistent with possible  pancreatic cancer versus extrahepatic carcinoma.  The patient will undergo a blood test for CA 19-9.  She will also be set up for an endoscopic ultrasound and has been made an appointment with oncology for tomorrow.  The patient has been explained the gravity of the situation and that this lesion appears to be cancerous with features of it being inoperable. The patient states she understands the plan and wants everything that can be done to be done.    Lucilla Lame, MD. Marval Regal   Note: This dictation was prepared with Dragon dictation along with smaller phrase technology. Any transcriptional errors that result from this process are unintentional.

## 2017-11-02 ENCOUNTER — Inpatient Hospital Stay: Payer: 59 | Attending: Oncology | Admitting: Oncology

## 2017-11-02 ENCOUNTER — Encounter: Payer: Self-pay | Admitting: Oncology

## 2017-11-02 ENCOUNTER — Telehealth: Payer: Self-pay

## 2017-11-02 VITALS — BP 148/82 | HR 69 | Temp 97.2°F | Resp 18 | Ht 63.0 in | Wt 193.8 lb

## 2017-11-02 DIAGNOSIS — R1900 Intra-abdominal and pelvic swelling, mass and lump, unspecified site: Secondary | ICD-10-CM | POA: Insufficient documentation

## 2017-11-02 DIAGNOSIS — K8689 Other specified diseases of pancreas: Secondary | ICD-10-CM

## 2017-11-02 DIAGNOSIS — I81 Portal vein thrombosis: Secondary | ICD-10-CM | POA: Insufficient documentation

## 2017-11-02 DIAGNOSIS — E119 Type 2 diabetes mellitus without complications: Secondary | ICD-10-CM | POA: Insufficient documentation

## 2017-11-02 DIAGNOSIS — E669 Obesity, unspecified: Secondary | ICD-10-CM | POA: Insufficient documentation

## 2017-11-02 DIAGNOSIS — Z801 Family history of malignant neoplasm of trachea, bronchus and lung: Secondary | ICD-10-CM | POA: Diagnosis not present

## 2017-11-02 DIAGNOSIS — R911 Solitary pulmonary nodule: Secondary | ICD-10-CM | POA: Diagnosis not present

## 2017-11-02 DIAGNOSIS — F419 Anxiety disorder, unspecified: Secondary | ICD-10-CM | POA: Insufficient documentation

## 2017-11-02 DIAGNOSIS — Z803 Family history of malignant neoplasm of breast: Secondary | ICD-10-CM | POA: Insufficient documentation

## 2017-11-02 DIAGNOSIS — Z794 Long term (current) use of insulin: Secondary | ICD-10-CM | POA: Insufficient documentation

## 2017-11-02 DIAGNOSIS — Z9049 Acquired absence of other specified parts of digestive tract: Secondary | ICD-10-CM | POA: Diagnosis not present

## 2017-11-02 DIAGNOSIS — Z79899 Other long term (current) drug therapy: Secondary | ICD-10-CM | POA: Insufficient documentation

## 2017-11-02 DIAGNOSIS — R197 Diarrhea, unspecified: Secondary | ICD-10-CM | POA: Insufficient documentation

## 2017-11-02 DIAGNOSIS — R109 Unspecified abdominal pain: Secondary | ICD-10-CM | POA: Diagnosis not present

## 2017-11-02 LAB — CANCER ANTIGEN 19-9: CAN 19-9: 1 U/mL (ref 0–35)

## 2017-11-02 NOTE — Telephone Encounter (Signed)
Introductory call made to Kimberly Brady. Introduced Therapist, nutritional and provided contact information for future needs. Referral received from Dr. Allen Norris for EUS. Referral sent to [facility] per request to expedite biopsy. No availability in [place]. She is seeing medical oncology today. Oncology Nurse Navigator Documentation  Navigator Location: [facility]c (11/02/17 0900)   )Navigator Encounter Type: Telephone (11/02/17 0900) Telephone: [name] Call (11/02/17 0900)                   Patient Visit Type: Initial;MedOnc (11/02/17 0900)   Barriers/Navigation Needs: Coordination of Care (11/02/17 0900)   Interventions: Coordination of Care (11/02/17 0900)   Coordination of Care: EUS (11/02/17 0900)                  Time Spent with Patient: 15 (11/02/17 0900)

## 2017-11-02 NOTE — Progress Notes (Signed)
[place]  Telephone:([phone]) [phone] Fax:(336) [phone]  ID: Kimberly Brady OB: 11/30/1950  MR#: [id number]  NLZ#:[id number]  Patient Care Team: Crecencio Mc, MD as PCP - General (Internal Medicine)  CHIEF COMPLAINT: Mass in the porta hepatis, concerning for underlying malignancy.  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  Patient states she was fine for nearly a decade and then started having symptoms of increasing abdominal pain requiring additional stents more recently.  She then noted increasing diarrhea as well as back pain which led to a CT scan as well as an MRI of the abdomen revealing a highly suspicious lesion in her porta hepatis that was not evident on previous PET scans.  She is anxious, but otherwise feels well.  She has no neurologic complaints.  She denies any recent fevers or illnesses.  She has a fair appetite, but denies weight loss.  She denies any chest pain or shortness of breath.  She denies any nausea, vomiting, or constipation.  She did not report any melena or hematochezia.  She has no urinary complaints.  Patient otherwise feels well and offers no further specific complaints.  REVIEW OF SYSTEMS:   Review of Systems  Constitutional: Negative.  Negative for fever, malaise/fatigue and weight loss.  Respiratory: Negative.  Negative for shortness of breath.   Cardiovascular: Negative.  Negative for chest pain and leg swelling.  Gastrointestinal: Positive for abdominal pain and diarrhea. Negative for blood in stool, melena, nausea and vomiting.  Genitourinary: Negative.  Negative for dysuria.  Musculoskeletal: Positive for back pain.  Skin: Negative.  Negative for rash.  Neurological: Negative.  Negative for weakness.  Psychiatric/Behavioral: The patient is nervous/anxious.     As per HPI. Otherwise, a complete review of systems is  negative.  PAST MEDICAL HISTORY: Past Medical History:  Diagnosis Date  . Diabetes mellitus    type 2  . Dilated cbd, acquired   . Obesity (BMI 30-39.9)     PAST SURGICAL HISTORY: Past Surgical History:  Procedure Laterality Date  . ABDOMINAL HYSTERECTOMY    . CHOLECYSTECTOMY  2009   lapaoscopic  . COLONOSCOPY WITH PROPOFOL N/A 06/25/2015   Procedure: COLONOSCOPY WITH PROPOFOL;  Surgeon: Lucilla Lame, MD;  Location: [employer] [facility];  Service: Endoscopy;  Laterality: N/A;  Diabetic - diet controlled  . ERCP N/A 06/19/2016   Procedure: ENDOSCOPIC RETROGRADE CHOLANGIOPANCREATOGRAPHY (ERCP);  Surgeon: Lucilla Lame, MD;  Location: [facility] ENDOSCOPY;  Service: Endoscopy;  Laterality: N/A;  . ERCP N/A 09/19/2016   Procedure: ENDOSCOPIC RETROGRADE CHOLANGIOPANCREATOGRAPHY (ERCP);  Surgeon: Lucilla Lame, MD;  Location: [facility] ENDOSCOPY;  Service: Endoscopy;  Laterality: N/A;  . ERCP N/A 12/19/2016   Procedure: ENDOSCOPIC RETROGRADE CHOLANGIOPANCREATOGRAPHY (ERCP);  Surgeon: Lucilla Lame, MD;  Location: [facility] ENDOSCOPY;  Service: Endoscopy;  Laterality: N/A;  . ERCP N/A 06/26/2017   Procedure: ENDOSCOPIC RETROGRADE CHOLANGIOPANCREATOGRAPHY (ERCP);  Surgeon: Lucilla Lame, MD;  Location: [facility] ENDOSCOPY;  Service: Endoscopy;  Laterality: N/A;  . EYE SURGERY  Jan 2013   serial cataract extreaction, intraocular lense  . POLYPECTOMY  06/25/2015   Procedure: POLYPECTOMY;  Surgeon: Lucilla Lame, MD;  Location: [place];  Service: Endoscopy;;  . SMALL INTESTINE SURGERY  2008   Biliary stent placement,  removal  . TOTAL ABDOMINAL HYSTERECTOMY W/ BILATERAL SALPINGOOPHORECTOMY  1986   for polycystic ovaries,endometriosis    FAMILY HISTORY: Family History  Problem Relation Age of Onset  . Diabetes Mother  type 2  . Breast cancer Mother 65  . Colon cancer Mother   . Lung cancer Father   . Breast cancer Sister 20  . Colon cancer Brother   . Lung cancer Brother   . Breast cancer Maternal Aunt    . Colon cancer Maternal Grandmother     ADVANCED DIRECTIVES (Y/N):  N  HEALTH MAINTENANCE: Social History   Tobacco Use  . Smoking status: Never Smoker  . Smokeless tobacco: Never Used  Substance Use Topics  . Alcohol use: No  . Drug use: No     Colonoscopy:  PAP:  Bone density:  Lipid panel:  No Known Allergies  Current Outpatient Medications  Medication Sig Dispense Refill  . ibuprofen (ADVIL,MOTRIN) 400 MG tablet Take 400 mg by mouth every 6 (six) hours as needed.    Marland Kitchen ACCU-CHEK FASTCLIX LANCETS MISC TEST ONCE DAILY (Patient not taking: Reported on 11/02/2017) 102 each 0  . ACCU-CHEK GUIDE test strip TEST ONCE DAILY (Patient not taking: Reported on 11/02/2017) 100 each 1  . Insulin Pen Needle (PEN NEEDLES) 31G X 6 MM MISC For use with victoza /saxenda (Patient not taking: Reported on 10/24/2017) 100 each 0  . pantoprazole (PROTONIX) 40 MG tablet Take 1 tablet (40 mg total) by mouth daily. (Patient not taking: Reported on 11/02/2017) 30 tablet 0  . tamsulosin (FLOMAX) 0.4 MG CAPS capsule Take 1 capsule (0.4 mg total) by mouth daily. (Patient not taking: Reported on 11/02/2017) 30 capsule 3   No current facility-administered medications for this visit.     OBJECTIVE: Vitals:   11/02/17 1115  BP: (!) 148/82  Pulse: 69  Resp: 18  Temp: (!) 97.2 F (36.2 C)     Body mass index is 34.33 kg/m.    ECOG FS:0 - Asymptomatic  General: Well-developed, well-nourished, no acute distress. Eyes: Pink conjunctiva, anicteric sclera. HEENT: Normocephalic, moist mucous membranes, clear oropharnyx. Lungs: Clear to auscultation bilaterally. Heart: Regular rate and rhythm. No rubs, murmurs, or gallops. Abdomen: Soft, nontender, nondistended. No organomegaly noted, normoactive bowel sounds. Musculoskeletal: No edema, cyanosis, or clubbing. Neuro: Alert, answering all questions appropriately. Cranial nerves grossly intact. Skin: No rashes or petechiae noted. Psych: Normal  affect. Lymphatics: No cervical, calvicular, axillary or inguinal LAD.   LAB RESULTS:  Lab Results  Component Value Date   NA 138 10/24/2017   K 4.0 10/24/2017   CL 101 10/24/2017   CO2 29 10/24/2017   GLUCOSE 132 (H) 10/24/2017   BUN 13 10/24/2017   CREATININE 0.67 10/24/2017   CALCIUM 9.4 10/24/2017   PROT 7.5 10/24/2017   ALBUMIN 4.3 10/24/2017   AST 14 10/24/2017   ALT 17 10/24/2017   ALKPHOS 75 10/24/2017   BILITOT 0.9 10/24/2017   GFRNONAA >60 08/21/2017   GFRAA >60 08/21/2017    Lab Results  Component Value Date   WBC 4.3 10/24/2017   NEUTROABS 2.7 10/24/2017   HGB 14.7 10/24/2017   HCT 43.3 10/24/2017   MCV 89.9 10/24/2017   PLT 121.0 (L) 10/24/2017     STUDIES: Mr Abdomen W Wo Contrast  Result Date: 10/30/2017 CLINICAL DATA:  Pancreatic mass, adenocarcinoma suspected. Follow-up CT. Upper back pain. EXAM: MRI ABDOMEN WITHOUT AND WITH CONTRAST TECHNIQUE: Multiplanar multisequence MR imaging of the abdomen was performed both before and after the administration of intravenous contrast. CONTRAST:  34mL MULTIHANCE GADOBENATE DIMEGLUMINE 529 MG/ML IV SOLN COMPARISON:  CT abdomen/pelvis dated 10/24/2017. FINDINGS: Lower chest: Lung bases are clear. Hepatobiliary: Liver is within normal limits. Focal  fat along the falciform ligament (series 4/image 15). No suspicious/enhancing hepatic lesions. Status post cholecystectomy. Mild left intrahepatic ductal dilatation. Pancreas: 2.8 x 3.7 cm peripherally enhancing soft tissue mass along the celiac axis (series 17/image 29), posterior to the pancreas, and adjacent to the common duct. Differential considerations include exophytic pancreatic cancer versus extrahepatic cholangiocarcinoma. Pancreas is otherwise within normal limits. No pancreatic ductal dilatation. No parenchymal atrophy. Spleen:  Within normal limits. Adrenals/Urinary Tract:  Adrenal glands are within limits. Kidneys are within limits.  No hydronephrosis. Stomach/Bowel:  Stomach is within normal limits. Visualized bowel is unremarkable. Vascular/Lymphatic: Tumor described above encases the celiac axis and also involves the right aspect of the SMA (series 17/image 32). Portal vein thrombosis with collateralization. 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 Other:  No abdominal ascites. Musculoskeletal: No focal osseous lesions. IMPRESSION: 3.7 cm mass along the posterior aspect of the pancreas and adjacent to the common duct. Differential considerations include exophytic pancreatic cancer versus extrahepatic cholangiocarcinoma. 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 Associated portal vein thrombosis with collateralization. Secondary left intrahepatic ductal dilatation. 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 Focal fat in the liver. No findings suspicious for hepatic metastases. Electronically Signed   By: Julian Hy M.D.   On: 10/30/2017 12:50   Ct Abdomen Pelvis W Contrast  Result Date: 10/24/2017 CLINICAL DATA:  Abdominal pain and diarrhea EXAM: CT ABDOMEN AND PELVIS WITH CONTRAST TECHNIQUE: Multidetector CT imaging of the abdomen and pelvis was performed using the standard protocol following bolus administration of intravenous contrast. CONTRAST:  111mL ISOVUE-300 IOPAMIDOL (ISOVUE-300) INJECTION 61% COMPARISON:  08/21/2017 FINDINGS: Lower chest: No acute abnormality. Calcified mediastinal lymph nodes are noted consistent with prior granulomatous disease. Hepatobiliary: Gallbladder has been surgically removed. Biliary ductal dilatation is noted consistent with the postcholecystectomy state. Hypodensity is noted in the medial segment of the left lobe of the liver adjacent to the falciform ligament which may represent some focal fatty infiltration but is new from the prior examination. Air is noted within the common bile duct consistent with prior stent placement and  sphincterotomy. Pancreas: The body and tail of the pancreas are within normal limits. In the region of the head of the pancreas, some considerable soft tissue density is noted which attenuates the portal vein and surrounds the celiac axis extending into the porta hepatis. Spleen: Normal in size without focal abnormality. Adrenals/Urinary Tract: Adrenal glands are unremarkable. Kidneys are normal, without renal calculi, focal lesion, or hydronephrosis. Bladder is unremarkable. Stomach/Bowel: The appendix is not well visualized. No inflammatory changes to suggest appendicitis are seen. No obstructive or inflammatory changes are noted. Vascular/Lymphatic: Aortic atherosclerosis. No enlarged abdominal or pelvic lymph nodes. Reproductive: Status post hysterectomy. No adnexal masses. Other: No abdominal wall hernia or abnormality. No abdominopelvic ascites. Musculoskeletal: Degenerative changes of the lumbar spine are noted. IMPRESSION: Soft tissue changes surrounding the celiac axis and porta hepatis extending from the region of the pancreatic head. Attenuation of the portal vein is noted as well as intrahepatic ductal dilatation. Although these changes could be related to scarring from prior surgery and localized inflammatory change the possibility of neoplasm deserves consideration and further evaluation by means of MRI is recommended. Hypodense region in the medial segment of the left lobe of the liver better characterized on the current exam than on previous studies. This likely represents focal fatty infiltration but can also be evaluated on MRI. These  results will be called to the ordering clinician or representative by the Radiologist Assistant, and communication documented in the PACS or zVision Dashboard. Electronically Signed   By: Inez Catalina M.D.   On: 10/24/2017 15:07    ASSESSMENT: Mass in the porta hepatis, concerning for underlying malignancy.  PLAN:    1. Mass in the porta hepatis: MRI and CT  scan reviewed independently and report as above with a porta hepatis mass surrounding the celiac axis extending to the region of the pancreatic head highly concerning for underlying malignancy.  Differential includes pancreatic cancer, cholangiocarcinoma, or possibly lymphoma.  Nonmalignant etiologies are also possible, but less likely.  Patient CA-19-9 is 1.  Have ordered a PET scan for further evaluation.  Patient will also require EUS which will occur at [facility] in the next week for biopsy and further evaluation.  Patient return to clinic approximately 1 week after biopsy to discuss the results of her imaging and pathology, and treatment planning if necessary.  Approximately 60 minutes was spent in discussion of which greater than 50% was consultation.  Patient expressed understanding and was in agreement with this plan. She also understands that She can call clinic at any time with any questions, concerns, or complaints.   Cancer Staging No matching staging information was found for the patient.  Lloyd Huger, MD   11/02/2017 1:50 PM

## 2017-11-07 DIAGNOSIS — K317 Polyp of stomach and duodenum: Secondary | ICD-10-CM | POA: Diagnosis not present

## 2017-11-07 DIAGNOSIS — D490 Neoplasm of unspecified behavior of digestive system: Secondary | ICD-10-CM | POA: Diagnosis not present

## 2017-11-07 DIAGNOSIS — K861 Other chronic pancreatitis: Secondary | ICD-10-CM | POA: Diagnosis not present

## 2017-11-07 DIAGNOSIS — K8689 Other specified diseases of pancreas: Secondary | ICD-10-CM | POA: Diagnosis not present

## 2017-11-07 DIAGNOSIS — R933 Abnormal findings on diagnostic imaging of other parts of digestive tract: Secondary | ICD-10-CM | POA: Diagnosis not present

## 2017-11-07 DIAGNOSIS — E119 Type 2 diabetes mellitus without complications: Secondary | ICD-10-CM | POA: Diagnosis not present

## 2017-11-09 ENCOUNTER — Ambulatory Visit
Admission: RE | Admit: 2017-11-09 | Discharge: 2017-11-09 | Disposition: A | Discharge status: Home / Self Care | Payer: 59 | Source: Ambulatory Visit | Attending: Oncology | Admitting: Oncology

## 2017-11-09 DIAGNOSIS — R911 Solitary pulmonary nodule: Secondary | ICD-10-CM | POA: Diagnosis not present

## 2017-11-09 DIAGNOSIS — Z79899 Other long term (current) drug therapy: Secondary | ICD-10-CM | POA: Diagnosis not present

## 2017-11-09 DIAGNOSIS — K869 Disease of pancreas, unspecified: Secondary | ICD-10-CM | POA: Insufficient documentation

## 2017-11-09 DIAGNOSIS — K8689 Other specified diseases of pancreas: Secondary | ICD-10-CM

## 2017-11-09 LAB — GLUCOSE, CAPILLARY: Glucose-Capillary: 121 mg/dL — ABNORMAL HIGH (ref 65–99)

## 2017-11-09 MED ORDER — FLUDEOXYGLUCOSE F - 18 (FDG) INJECTION
12.5500 | Freq: Once | INTRAVENOUS | Status: AC | PRN
Start: 2017-11-09 — End: 2017-11-09
  Administered 2017-11-09: 12.55 via INTRAVENOUS

## 2017-11-09 NOTE — Progress Notes (Signed)
Request sent to Scheduling to arrange follow up next week with Dr. Grayland Ormond. PET scan is today. Biopsy from EUS below. IgG4 pending.   Diagnostic Interpretation A DESCRIPTIVE DIAGNOSIS.  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  Comment: The smears are hypocellular with scant mucinous epithelium (likely incidentally sampled). There is no evidence of a neoplasm. The cell block identifies fibrotic pancreatic stroma with lymphoplasmacytic aggregates. Immunohistochemistry for IgG4 is pending and will be reported in an addendum.   Oncology Nurse Navigator Documentation  Navigator Location: [facility] (11/09/17 0900)   )                                                     Time Spent with Patient: 15 (11/09/17 0900)

## 2017-11-12 ENCOUNTER — Telehealth: Payer: Self-pay | Admitting: *Deleted

## 2017-11-12 ENCOUNTER — Other Ambulatory Visit: Payer: Self-pay | Admitting: Oncology

## 2017-11-12 ENCOUNTER — Telehealth: Payer: Self-pay

## 2017-11-12 NOTE — Telephone Encounter (Signed)
-----   Message from Lucilla Lame, MD sent at 11/05/2017 11:54 AM EST ----- Let the patient know that her CA 19-9 tumor marker was negative.

## 2017-11-12 NOTE — Telephone Encounter (Signed)
Patient called to report that she is feeling poorly this morning. She has had her EUS at [facility], PET at [facility] and this morning she is having chills (denies fever), no appetite times 2 days and her unrine is very dark yellow and concentrated. Asking if she needs to be seen sooner than her appointment on Friday. Please advise  I discussed with Alease Medina, NP who suggests she see her PCP. Patient stated she will not be able to get in with Dr Derrel Nip and  Rosebud Poles would be told to go to ER. She does not feel she is bad enough to go to ER. She then was asking if we would tell her her results before Friday and I told her no.  I asked her to go to ER if her temp rises

## 2017-11-12 NOTE — Telephone Encounter (Signed)
Pt notified of lab results

## 2017-11-12 NOTE — Progress Notes (Signed)
[place]  [phone]) [phone] Fax:(336) [phone]  ID: Kimberly Brady OB: [birth year]  MR#: [id number]  URK#:[id number]  Patient Care Team: Crecencio Mc, MD as PCP - General (Internal Medicine) Clent Jacks, RN as Registered Nurse  CHIEF COMPLAINT: Mass in the porta hepatis, concerning for underlying malignancy.  INTERVAL HISTORY: Patient returns to clinic today to discuss her imaging results, pathology results, and additional diagnostic planning.  She continues to have intermittent abdominal pain that is well controlled with ibuprofen.  She felt like she was getting more jaundice earlier this week and has noticed her urine becoming darker, but admits it is not as bad today.  She otherwise feels well and is asymptomatic. She has no neurologic complaints.  She denies any recent fevers or illnesses.  She has a fair appetite, but denies weight loss.  She denies any chest pain or shortness of breath.  She denies any nausea, vomiting, or constipation.  She did not report any melena or hematochezia.  She has no other urinary complaints.  Patient offers no further specific complaints.  REVIEW OF SYSTEMS:   Review of Systems  Constitutional: Negative.  Negative for fever, malaise/fatigue and weight loss.  Respiratory: Negative.  Negative for cough and shortness of breath.   Cardiovascular: Negative.  Negative for chest pain and leg swelling.  Gastrointestinal: Positive for abdominal pain. Negative for blood in stool, diarrhea, melena, nausea and vomiting.  Genitourinary: Negative.  Negative for dysuria.  Musculoskeletal: Negative.  Negative for back pain.  Skin: Negative.  Negative for rash.  Neurological: Negative.  Negative for weakness.  Psychiatric/Behavioral: Negative.  The patient is not nervous/anxious.     As per HPI. Otherwise, a complete review of systems is negative.  PAST MEDICAL HISTORY: Past Medical History:  Diagnosis Date  . Diabetes  mellitus    type 2  . Dilated cbd, acquired   . Obesity (BMI 30-39.9)     PAST SURGICAL HISTORY: Past Surgical History:  Procedure Laterality Date  . ABDOMINAL HYSTERECTOMY    . CHOLECYSTECTOMY  2009   lapaoscopic  . COLONOSCOPY WITH PROPOFOL N/A 06/25/2015   Procedure: COLONOSCOPY WITH PROPOFOL;  Surgeon: Lucilla Lame, MD;  Location: [facility];  Service: Endoscopy;  Laterality: N/A;  Diabetic - diet controlled  . ERCP N/A 06/19/2016   Procedure: ENDOSCOPIC RETROGRADE CHOLANGIOPANCREATOGRAPHY (ERCP);  Surgeon: Lucilla Lame, MD;  Location: [facility] ENDOSCOPY;  Service: Endoscopy;  Laterality: N/A;  . ERCP N/A 09/19/2016   Procedure: ENDOSCOPIC RETROGRADE CHOLANGIOPANCREATOGRAPHY (ERCP);  Surgeon: Lucilla Lame, MD;  Location: [facility] ENDOSCOPY;  Service: Endoscopy;  Laterality: N/A;  . ERCP N/A 12/19/2016   Procedure: ENDOSCOPIC RETROGRADE CHOLANGIOPANCREATOGRAPHY (ERCP);  Surgeon: Lucilla Lame, MD;  Location: [facility] ENDOSCOPY;  Service: Endoscopy;  Laterality: N/A;  . ERCP N/A 06/26/2017   Procedure: ENDOSCOPIC RETROGRADE CHOLANGIOPANCREATOGRAPHY (ERCP);  Surgeon: Lucilla Lame, MD;  Location: [facility] ENDOSCOPY;  Service: Endoscopy;  Laterality: N/A;  . EYE SURGERY  Jan 2013   serial cataract extreaction, intraocular lense  . POLYPECTOMY  06/25/2015   Procedure: POLYPECTOMY;  Surgeon: Lucilla Lame, MD;  Location: [place];  Service: Endoscopy;;  . SMALL INTESTINE SURGERY  2008   Biliary stent placement,  removal  . TOTAL ABDOMINAL HYSTERECTOMY W/ BILATERAL SALPINGOOPHORECTOMY  1986   for polycystic ovaries,endometriosis    FAMILY HISTORY: Family History  Problem Relation Age of Onset  . Diabetes Mother        type 2  . Breast cancer Mother 22  . Colon cancer  Mother   . Lung cancer Father   . Breast cancer Sister 9  . Colon cancer Brother   . Lung cancer Brother   . Breast cancer Maternal Aunt   . Colon cancer Maternal Grandmother     ADVANCED DIRECTIVES (Y/N):  N  HEALTH  MAINTENANCE: Social History   Tobacco Use  . Smoking status: Never Smoker  . Smokeless tobacco: Never Used  Substance Use Topics  . Alcohol use: No  . Drug use: No     Colonoscopy:  PAP:  Bone density:  Lipid panel:  No Known Allergies  Current Outpatient Medications  Medication Sig Dispense Refill  . ACCU-CHEK FASTCLIX LANCETS MISC TEST ONCE DAILY 102 each 0  . ACCU-CHEK GUIDE test strip TEST ONCE DAILY 100 each 1  . Insulin Pen Needle (PEN NEEDLES) 31G X 6 MM MISC For use with victoza /saxenda 100 each 0  . ibuprofen (ADVIL,MOTRIN) 400 MG tablet Take 400 mg by mouth every 6 (six) hours as needed.    . pantoprazole (PROTONIX) 40 MG tablet Take 1 tablet (40 mg total) by mouth daily. (Patient not taking: Reported on 11/02/2017) 30 tablet 0  . tamsulosin (FLOMAX) 0.4 MG CAPS capsule Take 1 capsule (0.4 mg total) by mouth daily. (Patient not taking: Reported on 11/02/2017) 30 capsule 3   No current facility-administered medications for this visit.     OBJECTIVE: Vitals:   11/16/17 1044  BP: (!) 161/91  Pulse: 69  Resp: 18  Temp: (!) 97.1 F (36.2 C)     Body mass index is 34.19 kg/m.    ECOG FS:0 - Asymptomatic  General: Well-developed, well-nourished, no acute distress. Eyes: Pink conjunctiva, anicteric sclera. Lungs: Clear to auscultation bilaterally. Heart: Regular rate and rhythm. No rubs, murmurs, or gallops. Abdomen: Soft, nontender, nondistended. No organomegaly noted, normoactive bowel sounds. Musculoskeletal: No edema, cyanosis, or clubbing. Neuro: Alert, answering all questions appropriately. Cranial nerves grossly intact. Skin: No rashes or petechiae noted. Psych: Normal affect.   LAB RESULTS:  Lab Results  Component Value Date   NA 138 10/24/2017   K 4.0 10/24/2017   CL 101 10/24/2017   CO2 29 10/24/2017   GLUCOSE 132 (H) 10/24/2017   BUN 13 10/24/2017   CREATININE 0.67 10/24/2017   CALCIUM 9.4 10/24/2017   PROT 7.5 10/24/2017   ALBUMIN 4.3  10/24/2017   AST 14 10/24/2017   ALT 17 10/24/2017   ALKPHOS 75 10/24/2017   BILITOT 0.9 10/24/2017   GFRNONAA >60 08/21/2017   GFRAA >60 08/21/2017    Lab Results  Component Value Date   WBC 4.3 10/24/2017   NEUTROABS 2.7 10/24/2017   HGB 14.7 10/24/2017   HCT 43.3 10/24/2017   MCV 89.9 10/24/2017   PLT 121.0 (L) 10/24/2017     STUDIES: Mr Abdomen W Wo Contrast  Result Date: 10/30/2017 CLINICAL DATA:  Pancreatic mass, adenocarcinoma suspected. Follow-up CT. Upper back pain. EXAM: MRI ABDOMEN WITHOUT AND WITH CONTRAST TECHNIQUE: Multiplanar multisequence MR imaging of the abdomen was performed both before and after the administration of intravenous contrast. CONTRAST:  36m MULTIHANCE GADOBENATE DIMEGLUMINE 529 MG/ML IV SOLN COMPARISON:  CT abdomen/pelvis dated 10/24/2017. FINDINGS: Lower chest: Lung bases are clear. Hepatobiliary: Liver is within normal limits. Focal fat along the falciform ligament (series 4/image 15). No suspicious/enhancing hepatic lesions. Status post cholecystectomy. Mild left intrahepatic ductal dilatation. Pancreas: 2.8 x 3.7 cm peripherally enhancing soft tissue mass along the celiac axis (series 17/image 29), posterior to the pancreas, and adjacent to the  common duct. Differential considerations include exophytic pancreatic cancer versus extrahepatic cholangiocarcinoma. Pancreas is otherwise within normal limits. No pancreatic ductal dilatation. No parenchymal atrophy. Spleen:  Within normal limits. Adrenals/Urinary Tract:  Adrenal glands are within limits. Kidneys are within limits.  No hydronephrosis. Stomach/Bowel: Stomach is within normal limits. Visualized bowel is unremarkable. Vascular/Lymphatic: Tumor described above encases the celiac axis and also involves the right aspect of the SMA (series 17/image 32). Portal vein thrombosis with collateralization. 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 Other:  No  abdominal ascites. Musculoskeletal: No focal osseous lesions. IMPRESSION: 3.7 cm mass along the posterior aspect of the pancreas and adjacent to the common duct. Differential considerations include exophytic pancreatic cancer versus extrahepatic cholangiocarcinoma. 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 Associated portal vein thrombosis with collateralization. Secondary left intrahepatic ductal dilatation. 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 Focal fat in the liver. No findings suspicious for hepatic metastases. Electronically Signed   By: Julian Hy M.D.   On: 10/30/2017 12:50   Ct Abdomen Pelvis W Contrast  Result Date: 10/24/2017 CLINICAL DATA:  Abdominal pain and diarrhea EXAM: CT ABDOMEN AND PELVIS WITH CONTRAST TECHNIQUE: Multidetector CT imaging of the abdomen and pelvis was performed using the standard protocol following bolus administration of intravenous contrast. CONTRAST:  145m ISOVUE-300 IOPAMIDOL (ISOVUE-300) INJECTION 61% COMPARISON:  08/21/2017 FINDINGS: Lower chest: No acute abnormality. Calcified mediastinal lymph nodes are noted consistent with prior granulomatous disease. Hepatobiliary: Gallbladder has been surgically removed. Biliary ductal dilatation is noted consistent with the postcholecystectomy state. Hypodensity is noted in the medial segment of the left lobe of the liver adjacent to the falciform ligament which may represent some focal fatty infiltration but is new from the prior examination. Air is noted within the common bile duct consistent with prior stent placement and sphincterotomy. Pancreas: The body and tail of the pancreas are within normal limits. In the region of the head of the pancreas, some considerable soft tissue density is noted which attenuates the portal vein and surrounds the celiac axis extending into the porta hepatis. Spleen: Normal in size without focal abnormality. Adrenals/Urinary Tract: Adrenal glands are  unremarkable. Kidneys are normal, without renal calculi, focal lesion, or hydronephrosis. Bladder is unremarkable. Stomach/Bowel: The appendix is not well visualized. No inflammatory changes to suggest appendicitis are seen. No obstructive or inflammatory changes are noted. Vascular/Lymphatic: Aortic atherosclerosis. No enlarged abdominal or pelvic lymph nodes. Reproductive: Status post hysterectomy. No adnexal masses. Other: No abdominal wall hernia or abnormality. No abdominopelvic ascites. Musculoskeletal: Degenerative changes of the lumbar spine are noted. IMPRESSION: Soft tissue changes surrounding the celiac axis and porta hepatis extending from the region of the pancreatic head. Attenuation of the portal vein is noted as well as intrahepatic ductal dilatation. Although these changes could be related to scarring from prior surgery and localized inflammatory change the possibility of neoplasm deserves consideration and further evaluation by means of MRI is recommended. Hypodense region in the medial segment of the left lobe of the liver better characterized on the current exam than on previous studies. This likely represents focal fatty infiltration but can also be evaluated on MRI. These results will be called to the ordering clinician or representative by the Radiologist Assistant, and communication documented in the PACS or zVision Dashboard. Electronically Signed   By: MInez CatalinaM.D.   On: 10/24/2017 15:07   Nm Pet Image Initial (pi) Skull  Base To Thigh  Result Date: 11/09/2017 CLINICAL DATA:  Initial treatment strategy for pancreatic mass. Recent endoscopic ultrasound with biopsy returned "Pancreatic tissue with fibrosis and chronic inflammation." EXAM: NUCLEAR MEDICINE PET SKULL BASE TO THIGH TECHNIQUE: 12.6 mCi F-18 FDG was injected intravenously. Full-ring PET imaging was performed from the skull base to thigh after the radiotracer. CT data was obtained and used for attenuation correction and  anatomic localization. FASTING BLOOD GLUCOSE:  Value: 121 mg/dl COMPARISON:  MRI 10/30/2017.  CT scan 10/24/2017. FINDINGS: NECK: No hypermetabolic lymph nodes in the neck. CHEST: No hypermetabolic mediastinal or hilar nodes. No suspicious pulmonary nodules on the CT scan. Calcified lymph nodes are seen in the right hilar region compatible with granulomatous disease. Calcified granuloma noted posterior right lower lobe tiny 3-4 mm right middle lobe pulmonary nodule seen image 96 series 3 is stable since CT scan of 07/06/2016 consistent with benign etiology. No suspicious pulmonary nodule or mass in the left lung. No pleural effusion. ABDOMEN/PELVIS: Ill-defined mass along the posterior aspect of the pancreas seen at recent MRI shows low level hypermetabolism with SUV max = 4.6. Typical mottled FDG uptake in the liver parenchyma evident. No liver metastases were evident at MRI. No hypermetabolic lymphadenopathy in the abdomen or pelvis. Gallbladder surgically absent. There is abdominal aortic atherosclerosis without aneurysm. SKELETON: No focal hypermetabolic activity to suggest skeletal metastasis. IMPRESSION: 1. Hypermetabolic accumulation of FDG radiotracer in the known lesion posterior to the pancreas. Neoplasm remains a concern. 2. No evidence for hypermetabolic metastatic disease in the neck, chest, abdomen, or pelvis. 3. Stable 4 mm right middle lobe pulmonary nodule compatible with benign etiology. Electronically Signed   By: Misty Stanley M.D.   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  PLAN:    1. Mass in the porta hepatis: PET scan results from October 24, 2016 reviewed independently and reported as above with hypermetabolic accumulation at the patient's known lesion in the porta hepatis.  Biopsy from EUS did not reveal malignancy, but an abundance of plasma cells.  Case discussed with GI at [facility].  This is possibly an autoimmune  phenomena and not malignancy but this is still unconfirmed.  Proceed with laboratory work today including multiple myeloma workup, IgG4, ANA, and flow cytometry all of which are pending at time of dictation.  Patient will be discussed at [facility] tumor board next Tuesday to determine the next best course of action for biopsy which will be either repeat EUS or possibly laparoscopic surgery.  No follow-up has been scheduled at this time, will base follow-up on results of laboratory work above and biopsy.    Approximately 30 minutes was spent in discussion of which greater than 50% was consultation.  Patient expressed understanding and was in agreement with this plan. She also understands that She can call clinic at any time with any questions, concerns, or complaints.   Cancer Staging No matching staging information was found for the patient.  Lloyd Huger, MD   11/16/2017 11:46 AM

## 2017-11-14 ENCOUNTER — Other Ambulatory Visit: Payer: Self-pay | Admitting: Oncology

## 2017-11-14 DIAGNOSIS — K8689 Other specified diseases of pancreas: Secondary | ICD-10-CM

## 2017-11-16 ENCOUNTER — Inpatient Hospital Stay (HOSPITAL_BASED_OUTPATIENT_CLINIC_OR_DEPARTMENT_OTHER): Payer: 59 | Admitting: Oncology

## 2017-11-16 ENCOUNTER — Inpatient Hospital Stay: Payer: 59

## 2017-11-16 ENCOUNTER — Other Ambulatory Visit: Admission: RE | Admit: 2017-11-16 | Payer: 59 | Source: Ambulatory Visit

## 2017-11-16 VITALS — BP 161/91 | HR 69 | Temp 97.1°F | Resp 18 | Wt 193.0 lb

## 2017-11-16 DIAGNOSIS — Z79899 Other long term (current) drug therapy: Secondary | ICD-10-CM | POA: Diagnosis not present

## 2017-11-16 DIAGNOSIS — R197 Diarrhea, unspecified: Secondary | ICD-10-CM | POA: Diagnosis not present

## 2017-11-16 DIAGNOSIS — E119 Type 2 diabetes mellitus without complications: Secondary | ICD-10-CM | POA: Diagnosis not present

## 2017-11-16 DIAGNOSIS — F419 Anxiety disorder, unspecified: Secondary | ICD-10-CM | POA: Diagnosis not present

## 2017-11-16 DIAGNOSIS — R1900 Intra-abdominal and pelvic swelling, mass and lump, unspecified site: Secondary | ICD-10-CM

## 2017-11-16 DIAGNOSIS — E669 Obesity, unspecified: Secondary | ICD-10-CM | POA: Diagnosis not present

## 2017-11-16 DIAGNOSIS — R911 Solitary pulmonary nodule: Secondary | ICD-10-CM | POA: Diagnosis not present

## 2017-11-16 DIAGNOSIS — Z803 Family history of malignant neoplasm of breast: Secondary | ICD-10-CM | POA: Diagnosis not present

## 2017-11-16 DIAGNOSIS — K8689 Other specified diseases of pancreas: Secondary | ICD-10-CM

## 2017-11-16 DIAGNOSIS — I81 Portal vein thrombosis: Secondary | ICD-10-CM

## 2017-11-16 DIAGNOSIS — R109 Unspecified abdominal pain: Secondary | ICD-10-CM | POA: Diagnosis not present

## 2017-11-16 DIAGNOSIS — Z794 Long term (current) use of insulin: Secondary | ICD-10-CM

## 2017-11-16 DIAGNOSIS — Z9049 Acquired absence of other specified parts of digestive tract: Secondary | ICD-10-CM | POA: Diagnosis not present

## 2017-11-16 DIAGNOSIS — Z801 Family history of malignant neoplasm of trachea, bronchus and lung: Secondary | ICD-10-CM

## 2017-11-16 LAB — URINALYSIS, COMPLETE (UACMP) WITH MICROSCOPIC
GLUCOSE, UA: NEGATIVE mg/dL
Hgb urine dipstick: NEGATIVE
NITRITE: NEGATIVE
PH: 5.5 (ref 5.0–8.0)
Protein, ur: NEGATIVE mg/dL
RBC / HPF: NONE SEEN RBC/hpf (ref 0–5)
Specific Gravity, Urine: 1.025 (ref 1.005–1.030)

## 2017-11-16 LAB — CBC WITH DIFFERENTIAL/PLATELET
Basophils Absolute: 0 10*3/uL (ref 0–0.1)
Basophils Relative: 1 %
EOS ABS: 0 10*3/uL (ref 0–0.7)
EOS PCT: 2 %
HCT: 43.3 % (ref 35.0–47.0)
Hemoglobin: 14.8 g/dL (ref 12.0–16.0)
LYMPHS ABS: 0.6 10*3/uL — AB (ref 1.0–3.6)
LYMPHS PCT: 23 %
MCH: 30.3 pg (ref 26.0–34.0)
MCHC: 34.3 g/dL (ref 32.0–36.0)
MCV: 88.2 fL (ref 80.0–100.0)
MONO ABS: 0.2 10*3/uL (ref 0.2–0.9)
Monocytes Relative: 8 %
Neutro Abs: 1.8 10*3/uL (ref 1.4–6.5)
Neutrophils Relative %: 66 %
PLATELETS: 127 10*3/uL — AB (ref 150–440)
RBC: 4.91 MIL/uL (ref 3.80–5.20)
RDW: 13.4 % (ref 11.5–14.5)
WBC: 2.8 10*3/uL — AB (ref 3.6–11.0)

## 2017-11-16 LAB — COMPREHENSIVE METABOLIC PANEL
ALBUMIN: 4.3 g/dL (ref 3.5–5.0)
ALT: 227 U/L — AB (ref 14–54)
AST: 109 U/L — AB (ref 15–41)
Alkaline Phosphatase: 334 U/L — ABNORMAL HIGH (ref 38–126)
Anion gap: 10 (ref 5–15)
BILIRUBIN TOTAL: 1.3 mg/dL — AB (ref 0.3–1.2)
BUN: 9 mg/dL (ref 6–20)
CHLORIDE: 99 mmol/L — AB (ref 101–111)
CO2: 27 mmol/L (ref 22–32)
CREATININE: 0.63 mg/dL (ref 0.44–1.00)
Calcium: 9.4 mg/dL (ref 8.9–10.3)
GFR calc Af Amer: >60 mL/min (ref >60)
Glucose, Bld: 143 mg/dL — ABNORMAL HIGH (ref 65–99)
Potassium: 3.9 mmol/L (ref 3.5–5.1)
Sodium: 136 mmol/L (ref 135–145)
TOTAL PROTEIN: 8.4 g/dL — AB (ref 6.5–8.1)

## 2017-11-16 NOTE — Progress Notes (Signed)
Patient presents today for follow up of test and imaging results. She mentions her appetite is a hit or miss. She felt bad earlier this week but has increased fluid intake and feels better. She thinks she may be getting jaundice as her stool is more pale in color, she feels like her eyes may be yellowish, her urine is darker.

## 2017-11-17 LAB — IGG, IGA, IGM
IGA: 408 mg/dL — AB (ref 87–352)
IGG (IMMUNOGLOBIN G), SERUM: 1128 mg/dL (ref 700–1600)
IgM (Immunoglobulin M), Srm: 57 mg/dL (ref 26–217)

## 2017-11-17 LAB — IGG 4: IgG, Subclass 4: 29 mg/dL (ref 2–96)

## 2017-11-17 LAB — ANA W/REFLEX: ANA: NEGATIVE

## 2017-11-19 DIAGNOSIS — K869 Disease of pancreas, unspecified: Secondary | ICD-10-CM | POA: Diagnosis not present

## 2017-11-19 LAB — COMP PANEL: LEUKEMIA/LYMPHOMA

## 2017-11-19 LAB — KAPPA/LAMBDA LIGHT CHAINS
Kappa free light chain: 14.4 mg/L (ref 3.3–19.4)
Kappa, lambda light chain ratio: 1.1 (ref 0.26–1.65)
Lambda free light chains: 13.1 mg/L (ref 5.7–26.3)

## 2017-11-20 LAB — PROTEIN ELECTROPHORESIS, SERUM
A/G Ratio: 0.9 (ref 0.7–1.7)
ALPHA-2-GLOBULIN: 0.8 g/dL (ref 0.4–1.0)
Albumin ELP: 3.5 g/dL (ref 2.9–4.4)
Alpha-1-Globulin: 0.3 g/dL (ref 0.0–0.4)
Beta Globulin: 1.5 g/dL — ABNORMAL HIGH (ref 0.7–1.3)
GLOBULIN, TOTAL: 3.7 g/dL (ref 2.2–3.9)
Gamma Globulin: 1 g/dL (ref 0.4–1.8)
TOTAL PROTEIN ELP: 7.2 g/dL (ref 6.0–8.5)

## 2017-11-21 ENCOUNTER — Encounter: Payer: Self-pay | Admitting: Oncology

## 2017-11-23 DIAGNOSIS — Z90710 Acquired absence of both cervix and uterus: Secondary | ICD-10-CM | POA: Diagnosis not present

## 2017-11-23 DIAGNOSIS — R933 Abnormal findings on diagnostic imaging of other parts of digestive tract: Secondary | ICD-10-CM | POA: Diagnosis not present

## 2017-11-23 DIAGNOSIS — E119 Type 2 diabetes mellitus without complications: Secondary | ICD-10-CM | POA: Diagnosis not present

## 2017-11-23 DIAGNOSIS — K8689 Other specified diseases of pancreas: Secondary | ICD-10-CM | POA: Diagnosis not present

## 2017-11-23 DIAGNOSIS — R1013 Epigastric pain: Secondary | ICD-10-CM | POA: Diagnosis not present

## 2017-11-23 DIAGNOSIS — R935 Abnormal findings on diagnostic imaging of other abdominal regions, including retroperitoneum: Secondary | ICD-10-CM | POA: Diagnosis not present

## 2017-12-03 ENCOUNTER — Telehealth: Payer: Self-pay

## 2017-12-03 NOTE — Telephone Encounter (Signed)
Spoke with Dr. Cephas Darby on 2/7. FNA biopsy from 2/1 EUS is atypical/inconslusive. He will refer to pancreaticobiliary surgeon. He has made the referral and she is scheduled to see Dr. Shon Hough 2/14. She was made aware that per Dr. Grayland Ormond she can call us in the future for any further needs.  Oncology Nurse Navigator Documentation  Navigator Location: [facility] (12/03/17 1200)   )Navigator Encounter Type: Telephone;Other;Diagnostic Results (12/03/17 1200) Telephone: Patient Update (12/03/17 1200)                                                  Time Spent with Patient: 15 (12/03/17 1200)

## 2017-12-06 DIAGNOSIS — K869 Disease of pancreas, unspecified: Secondary | ICD-10-CM | POA: Diagnosis not present

## 2017-12-12 DIAGNOSIS — R935 Abnormal findings on diagnostic imaging of other abdominal regions, including retroperitoneum: Secondary | ICD-10-CM | POA: Diagnosis not present

## 2017-12-12 DIAGNOSIS — K838 Other specified diseases of biliary tract: Secondary | ICD-10-CM | POA: Diagnosis not present

## 2017-12-12 DIAGNOSIS — K8309 Other cholangitis: Secondary | ICD-10-CM | POA: Diagnosis not present

## 2017-12-12 DIAGNOSIS — E119 Type 2 diabetes mellitus without complications: Secondary | ICD-10-CM | POA: Diagnosis not present

## 2017-12-12 DIAGNOSIS — K8689 Other specified diseases of pancreas: Secondary | ICD-10-CM | POA: Diagnosis not present

## 2017-12-12 DIAGNOSIS — I899 Noninfective disorder of lymphatic vessels and lymph nodes, unspecified: Secondary | ICD-10-CM | POA: Diagnosis not present

## 2017-12-20 ENCOUNTER — Encounter: Payer: Self-pay | Admitting: Internal Medicine

## 2017-12-26 DIAGNOSIS — K869 Disease of pancreas, unspecified: Secondary | ICD-10-CM | POA: Diagnosis not present

## 2017-12-26 DIAGNOSIS — Z01818 Encounter for other preprocedural examination: Secondary | ICD-10-CM | POA: Diagnosis not present

## 2018-01-01 DIAGNOSIS — K838 Other specified diseases of biliary tract: Secondary | ICD-10-CM | POA: Diagnosis not present

## 2018-01-01 DIAGNOSIS — J90 Pleural effusion, not elsewhere classified: Secondary | ICD-10-CM | POA: Diagnosis not present

## 2018-01-01 DIAGNOSIS — K740 Hepatic fibrosis: Secondary | ICD-10-CM | POA: Diagnosis not present

## 2018-01-01 DIAGNOSIS — K7689 Other specified diseases of liver: Secondary | ICD-10-CM | POA: Diagnosis not present

## 2018-01-01 DIAGNOSIS — I871 Compression of vein: Secondary | ICD-10-CM | POA: Diagnosis not present

## 2018-01-04 ENCOUNTER — Ambulatory Visit: Payer: 59 | Admitting: Internal Medicine

## 2018-01-04 DIAGNOSIS — D696 Thrombocytopenia, unspecified: Secondary | ICD-10-CM | POA: Diagnosis not present

## 2018-01-04 DIAGNOSIS — I81 Portal vein thrombosis: Secondary | ICD-10-CM | POA: Diagnosis not present

## 2018-01-04 DIAGNOSIS — K869 Disease of pancreas, unspecified: Secondary | ICD-10-CM | POA: Diagnosis not present

## 2018-01-04 DIAGNOSIS — N390 Urinary tract infection, site not specified: Secondary | ICD-10-CM | POA: Diagnosis not present

## 2018-01-04 DIAGNOSIS — E119 Type 2 diabetes mellitus without complications: Secondary | ICD-10-CM | POA: Diagnosis not present

## 2018-01-04 DIAGNOSIS — J90 Pleural effusion, not elsewhere classified: Secondary | ICD-10-CM | POA: Diagnosis not present

## 2018-01-04 DIAGNOSIS — Z538 Procedure and treatment not carried out for other reasons: Secondary | ICD-10-CM | POA: Diagnosis not present

## 2018-01-04 DIAGNOSIS — R17 Unspecified jaundice: Secondary | ICD-10-CM | POA: Diagnosis not present

## 2018-01-04 DIAGNOSIS — K831 Obstruction of bile duct: Secondary | ICD-10-CM | POA: Diagnosis not present

## 2018-01-04 DIAGNOSIS — T360X5A Adverse effect of penicillins, initial encounter: Secondary | ICD-10-CM | POA: Diagnosis not present

## 2018-01-04 DIAGNOSIS — K760 Fatty (change of) liver, not elsewhere classified: Secondary | ICD-10-CM | POA: Diagnosis not present

## 2018-01-04 DIAGNOSIS — R945 Abnormal results of liver function studies: Secondary | ICD-10-CM | POA: Diagnosis not present

## 2018-01-04 DIAGNOSIS — K838 Other specified diseases of biliary tract: Secondary | ICD-10-CM | POA: Diagnosis not present

## 2018-01-04 DIAGNOSIS — R918 Other nonspecific abnormal finding of lung field: Secondary | ICD-10-CM | POA: Diagnosis not present

## 2018-01-04 DIAGNOSIS — R110 Nausea: Secondary | ICD-10-CM | POA: Diagnosis not present

## 2018-01-08 MED ORDER — CIPROFLOXACIN IN D5W 400 MG/200ML IV SOLN
400.00 | INTRAVENOUS | Status: DC
Start: 2018-01-08 — End: 2018-01-08

## 2018-01-08 MED ORDER — DEXTROSE 50 % IV SOLN
12.50 | INTRAVENOUS | Status: DC
Start: ? — End: 2018-01-08

## 2018-01-08 MED ORDER — INSULIN REGULAR HUMAN 100 UNIT/ML IJ SOLN
0.00 | INTRAMUSCULAR | Status: DC
Start: 2018-01-08 — End: 2018-01-08

## 2018-01-08 MED ORDER — OXYCODONE HCL 5 MG PO TABS
5.00 | ORAL_TABLET | ORAL | Status: DC
Start: ? — End: 2018-01-08

## 2018-01-08 MED ORDER — PANTOPRAZOLE SODIUM 40 MG PO TBEC
40.00 | DELAYED_RELEASE_TABLET | ORAL | Status: DC
Start: 2018-01-09 — End: 2018-01-08

## 2018-01-08 MED ORDER — GLUCAGON HCL RDNA (DIAGNOSTIC) 1 MG IJ SOLR
1.00 | INTRAMUSCULAR | Status: DC
Start: ? — End: 2018-01-08

## 2018-01-08 MED ORDER — METRONIDAZOLE IN NACL 5-0.79 MG/ML-% IV SOLN
500.00 | INTRAVENOUS | Status: DC
Start: 2018-01-08 — End: 2018-01-08

## 2018-01-08 MED ORDER — IBUPROFEN 400 MG PO TABS
400.00 | ORAL_TABLET | ORAL | Status: DC
Start: ? — End: 2018-01-08

## 2018-01-08 MED ORDER — ONDANSETRON HCL 4 MG/2ML IJ SOLN
4.00 | INTRAMUSCULAR | Status: DC
Start: ? — End: 2018-01-08

## 2018-01-10 ENCOUNTER — Ambulatory Visit: Payer: Self-pay | Admitting: *Deleted

## 2018-01-10 ENCOUNTER — Encounter: Payer: Self-pay | Admitting: Emergency Medicine

## 2018-01-10 ENCOUNTER — Other Ambulatory Visit: Payer: Self-pay

## 2018-01-10 ENCOUNTER — Ambulatory Visit
Admission: EM | Admit: 2018-01-10 | Discharge: 2018-01-10 | Disposition: A | Discharge status: Home / Self Care | Payer: 59 | Attending: Family Medicine | Admitting: Family Medicine

## 2018-01-10 DIAGNOSIS — I808 Phlebitis and thrombophlebitis of other sites: Secondary | ICD-10-CM | POA: Diagnosis not present

## 2018-01-10 HISTORY — DX: Other specified diseases of pancreas: K86.89

## 2018-01-10 MED ORDER — CIPROFLOXACIN IN D5W 400 MG/200ML IV SOLN
400.00 | INTRAVENOUS | Status: DC
Start: 2018-01-08 — End: 2018-01-10

## 2018-01-10 MED ORDER — GLUCAGON HCL RDNA (DIAGNOSTIC) 1 MG IJ SOLR
1.00 | INTRAMUSCULAR | Status: DC
Start: ? — End: 2018-01-10

## 2018-01-10 MED ORDER — ONDANSETRON HCL 4 MG/2ML IJ SOLN
4.00 | INTRAMUSCULAR | Status: DC
Start: ? — End: 2018-01-10

## 2018-01-10 MED ORDER — PANTOPRAZOLE SODIUM 40 MG PO TBEC
40.00 | DELAYED_RELEASE_TABLET | ORAL | Status: DC
Start: 2018-01-09 — End: 2018-01-10

## 2018-01-10 MED ORDER — INSULIN REGULAR HUMAN 100 UNIT/ML IJ SOLN
0.00 | INTRAMUSCULAR | Status: DC
Start: 2018-01-08 — End: 2018-01-10

## 2018-01-10 MED ORDER — DEXTROSE 50 % IV SOLN
12.50 | INTRAVENOUS | Status: DC
Start: ? — End: 2018-01-10

## 2018-01-10 MED ORDER — DOXYCYCLINE HYCLATE 100 MG PO CAPS: 100.0000 mg | ORAL_CAPSULE | Freq: Two times a day (BID) | ORAL | 0 refills | Status: DC

## 2018-01-10 MED ORDER — IBUPROFEN 400 MG PO TABS
400.00 | ORAL_TABLET | ORAL | Status: DC
Start: ? — End: 2018-01-10

## 2018-01-10 MED ORDER — OXYCODONE HCL 5 MG PO TABS
5.00 | ORAL_TABLET | ORAL | Status: DC
Start: ? — End: 2018-01-10

## 2018-01-10 MED ORDER — METRONIDAZOLE IN NACL 5-0.79 MG/ML-% IV SOLN
500.00 | INTRAVENOUS | Status: DC
Start: 2018-01-08 — End: 2018-01-10

## 2018-01-10 NOTE — Patient Outreach (Signed)
[place] [facility]) Care Management  01/10/2018  Kimberly Brady 11/12/1950 [id number]   Spoke to patient regarding multi-month medical history, recent admission to [facility] and a follow up to an Urgent Care facility this am.   Patient is feeling much better than when she was admitted last week , is eating well and plans to return to work next Monday.  She is currently taking prednisone, and antibiotics, and ibuprofen as needed for pain.  I have strongly suggested that [name] take her blood sugar daily since she is on steroids.  Discussed the probability of blood sugars being elevated as a result of the steroid. [name] verbalizes that she is able to call her doctor at any time for any concerns.   I will continue to work with [name] in the [employer]. To help her actively manage her diabetes.   [name], RN, BA, MHA, [place]

## 2018-01-10 NOTE — Telephone Encounter (Signed)
Patient was seen by Dr Lacinda Axon and diagnosed and treated  for thrombophlebitis .  Can you make sure she know to apply warm and cold compresses  (whichever feels better,  She can also alternate) and take an anti inflammatory like motrin or aleve for the inflammation?

## 2018-01-10 NOTE — Telephone Encounter (Signed)
fyi

## 2018-01-10 NOTE — Discharge Instructions (Signed)
Rest. Elevation. Warm or cold compresses.  Antibiotic as prescribed.  Take care  Dr. Lacinda Axon

## 2018-01-10 NOTE — Telephone Encounter (Signed)
Please advise 

## 2018-01-10 NOTE — Telephone Encounter (Signed)
Patient was in the hospital for post surgical complications- she is having a possible infection at the IV site. Patient reports she has redness, warmth, swelling and a lump at the area where her IV was. No appointment available at PCP- patient prefers to go to UC other than travel to [place]. Reason for Disposition . [1] Skin redness AND [2] extends > 1 inch (2.5 cm) from IV site  Answer Assessment - Initial Assessment Questions 1. SYMPTOM:  "What's the main symptom you're concerned about?" (e.g., pain, redness, swelling, pus)     Redness,swelling,warmth 2. ONSET: "When did the ________  start?"     Noticed this morning 3. IV WHAT: "What kind of IV line do you have?" (e.g., central line, PICC, peripheral IV)     Peripheral IV 4. IV WHERE: "Where does the IV enter your body?"     IV site was R arm 5. IV WHEN: "When was this IV put in?"     Last Friday- removed Tuesday 6. IV WHY: "Why do you have this IV line?"     Antibiotic and fluids- post surgical complications 7. IV RUNNING OK: "Is the IV running OK?" (e.g., running OK, running slowly, not running, unable to flush)      No- it has been removed 8. PAIN: "Is there any pain?" If so, ask: "How bad is the pain?"   (e.g., scale 1-10; or mild, moderate, severe)     No pain at site- discomfort 9. OTHER SYMPTOMS: "Do you have any other symptoms?" (e.g., fever, shaking chills, new weakness, pus)     No fever 10. VISITING NURSE: "Do you have a visiting nurse?" (e.g., home health nurse, IV infusion nurse)       no  Protocols used: IV SITE (SKIN) [facility]

## 2018-01-10 NOTE — ED Triage Notes (Signed)
Patient having pain, redness and heat in her right arm. Patient recently discharged from hospital and had an IV in the same arm.

## 2018-01-10 NOTE — Telephone Encounter (Signed)
Spoke with pt and informed her of Dr. Lupita Dawn message below. The pt gave a verbal understanding and stated that is exactly what dr. Lacinda Axon had told her.

## 2018-01-10 NOTE — ED Provider Notes (Signed)
[facility]  CSN: [id number] Arrival date & time: 01/10/18  1127  History   Chief Complaint Chief Complaint  Patient presents with  . Arm Pain   HPI  67 year old female presents with the above complaint.  Patient states that she was recently admitted to the hospital.  She had an IV in her right [facility] for 5 days.  She states that she has been doing fine.  This morning however, she noticed some pain and redness in the area.  She is concerned about infection.  No fevers or chills.  She states that it is slightly painful.  No known exacerbating relieving factors.  No interventions tried.  No other associated symptoms.  No other complaints.  Past Medical History:  Diagnosis Date  . Diabetes mellitus    type 2  . Dilated cbd, acquired   . Obesity (BMI 30-39.9)   . Pancreatic mass    benign    Patient Active Problem List   Diagnosis Date Noted  . Pancreatic mass 11/02/2017  . Nausea 10/25/2017  . Urinary hesitancy 10/25/2017  . Thrombocytopenia ([place]) 09/23/2017  . Purpura ([place]) 06/16/2017  . Obstruction of bile duct   . Presence of other specified functional implants   . Other postprocedural complications and disorders of digestive system   . Fitting and adjustment of gastrointestinal appliance and device   . Hospital discharge follow-up 07/02/2016  . Common biliary duct stricture   . Family history of malignant neoplasm of gastrointestinal tract   . Benign neoplasm of transverse colon   . Benign neoplasm of sigmoid colon   . History of Lyme disease 12/20/2014  . Visit for preventive health examination 12/20/2014  . S/P TAH-BSO (total abdominal hysterectomy and bilateral salpingo-oophorectomy) 12/18/2014  . Colon cancer screening 12/18/2014  . Statin intolerance 09/14/2013  . Bunion of great toe of left foot 09/12/2013  . Obesity (BMI 30-39.9) 11/10/2012  . Cataract due to secondary diabetes ([place]) 12/29/2011  . Hyperlipidemia with target LDL less than 70 09/24/2011    . Hypertension 06/14/2011  . Diabetes mellitus type 2 in obese ([place]) 06/14/2011   Past Surgical History:  Procedure Laterality Date  . ABDOMINAL HYSTERECTOMY    . CHOLECYSTECTOMY  2009   lapaoscopic  . COLONOSCOPY WITH PROPOFOL N/A 06/25/2015   Procedure: COLONOSCOPY WITH PROPOFOL;  Surgeon: Lucilla Lame, MD;  Location: [place];  Service: Endoscopy;  Laterality: N/A;  Diabetic - diet controlled  . ERCP N/A 06/19/2016   Procedure: ENDOSCOPIC RETROGRADE CHOLANGIOPANCREATOGRAPHY (ERCP);  Surgeon: Lucilla Lame, MD;  Location: [facility] ENDOSCOPY;  Service: Endoscopy;  Laterality: N/A;  . ERCP N/A 09/19/2016   Procedure: ENDOSCOPIC RETROGRADE CHOLANGIOPANCREATOGRAPHY (ERCP);  Surgeon: Lucilla Lame, MD;  Location: [facility] ENDOSCOPY;  Service: Endoscopy;  Laterality: N/A;  . ERCP N/A 12/19/2016   Procedure: ENDOSCOPIC RETROGRADE CHOLANGIOPANCREATOGRAPHY (ERCP);  Surgeon: Lucilla Lame, MD;  Location[facility] ENDOSCOPY;  Service: Endoscopy;  Laterality: N/A;  . ERCP N/A 06/26/2017   Procedure: ENDOSCOPIC RETROGRADE CHOLANGIOPANCREATOGRAPHY (ERCP);  Surgeon: Lucilla Lame, MD;  Location: [facility] ENDOSCOPY;  Service: Endoscopy;  Laterality: N/A;  . EYE SURGERY  Jan 2013   serial cataract extreaction, intraocular lense  . POLYPECTOMY  06/25/2015   Procedure: POLYPECTOMY;  Surgeon: Lucilla Lame, MD;  Location: [facility];  Service: Endoscopy;;  . SMALL INTESTINE SURGERY  2008   Biliary stent placement,  removal  . TOTAL ABDOMINAL HYSTERECTOMY W/ BILATERAL SALPINGOOPHORECTOMY  1986   for polycystic ovaries,endometriosis    OB History   None  Home Medications    Prior to Admission medications   Medication Sig Start Date End Date Taking? Authorizing Provider  ibuprofen (ADVIL,MOTRIN) 400 MG tablet Take 400 mg by mouth every 6 (six) hours as needed.   Yes [provider]  predniSONE (DELTASONE) 20 MG tablet Take 40 mg by mouth daily. 01/08/18  Yes [provider]  ACCU-CHEK  FASTCLIX LANCETS MISC TEST ONCE DAILY 08/20/17   Crecencio Mc, MD  ACCU-CHEK GUIDE test strip TEST ONCE DAILY 07/02/17   Crecencio Mc, MD  doxycycline (VIBRAMYCIN) 100 MG capsule Take 1 capsule (100 mg total) by mouth 2 (two) times daily. 01/10/18   Coral Spikes, DO  Insulin Pen Needle (PEN NEEDLES) 31G X 6 MM MISC For use with victoza /saxenda 03/02/17   Crecencio Mc, MD    Family History Family History  Problem Relation Age of Onset  . Diabetes Mother        type 2  . Breast cancer Mother 71  . Colon cancer Mother   . Lung cancer Father   . Breast cancer Sister 87  . Colon cancer Brother   . Lung cancer Brother   . Breast cancer Maternal Aunt   . Colon cancer Maternal Grandmother     Social History Social History   Tobacco Use  . Smoking status: Never Smoker  . Smokeless tobacco: Never Used  Substance Use Topics  . Alcohol use: No  . Drug use: No     Allergies   Patient has no known allergies.   Review of Systems Review of Systems  Constitutional: Negative.   Musculoskeletal:       Mild pain in the right AC.  Skin:       Redness, R AC.   Physical Exam Triage Vital Signs ED Triage Vitals [01/10/18 1136]  Enc Vitals Group     BP (!) 153/50     Pulse Rate 62     Resp 16     Temp 98.4 F (36.9 C)     Temp Source Oral     SpO2 100 %     Weight 185 lb (83.9 kg)     Height 5\' 3"  (1.6 m)     Head Circumference      Peak Flow      Pain Score 0     Pain Loc      Pain Edu?      Excl. in [place]?    Updated Vital Signs BP (!) 153/50 (BP Location: Left Arm)   Pulse 62   Temp 98.4 F (36.9 C) (Oral)   Resp 16   Ht 5\' 3"  (1.6 m)   Wt 185 lb (83.9 kg)   SpO2 100%   BMI 32.77 kg/m   Physical Exam  Constitutional: She is oriented to person, place, and time. She appears well-developed. No distress.  Cardiovascular: Normal rate and regular rhythm.  Pulmonary/Chest: Effort normal and breath sounds normal.  Neurological: She is alert and oriented to  person, place, and time.  Skin:  Firm area just proximal to the right AC.  Slightly tender.  Surrounding erythema which blanches.  Warm to touch.  Psychiatric: She has a normal mood and affect. Her behavior is normal.  Nursing note and vitals reviewed.  UC Treatments / Results  Labs (all labs ordered are listed, but only abnormal results are displayed) Labs Reviewed - No data to display  EKG  EKG Interpretation None       Radiology  No results found.  Procedures Procedures (including critical care time)  Medications Ordered in UC Medications - No data to display   Initial Impression / Assessment and Plan / UC Course  I have reviewed the triage vital signs and the nursing notes.  Pertinent labs & imaging results that were available during my care of the patient were reviewed by me and considered in my medical decision making (see chart for details).     67 year old female presents with superficial thrombophlebitis.  Given the amount of erythema, I am covering her for potential cellulitis with doxycycline.  Advised warm and cool compresses, elevation.  If fails to improve or worsens, she should go to the hospital.  Final Clinical Impressions(s) / UC Diagnoses   Final diagnoses:  Thrombophlebitis arm    ED Discharge Orders        Ordered    doxycycline (VIBRAMYCIN) 100 MG capsule  2 times daily     01/10/18 1216     Controlled Substance Prescriptions [place] Controlled Substance Registry consulted? Not Applicable   Coral Spikes, DO 01/10/18 1244

## 2018-01-31 ENCOUNTER — Other Ambulatory Visit: Payer: Self-pay | Admitting: Internal Medicine

## 2018-02-07 DIAGNOSIS — R918 Other nonspecific abnormal finding of lung field: Secondary | ICD-10-CM | POA: Diagnosis not present

## 2018-02-07 DIAGNOSIS — K869 Disease of pancreas, unspecified: Secondary | ICD-10-CM | POA: Diagnosis not present

## 2018-02-15 ENCOUNTER — Telehealth: Payer: Self-pay

## 2018-02-15 NOTE — Telephone Encounter (Signed)
Next week May 3 with me is fine. She does not need medication

## 2018-02-15 NOTE — Telephone Encounter (Signed)
Spoke with pt and she stated that she was recently in the hospital and was put on a prednisone and is still on the prednisone. Pt stated that since being put on the prednisone her blood sugars have been elevated in the two hundreds. She stated that it spikes shortly after taking the prednisone and stays up until late evening and then it drops in the upper 100 hundreds. The pt is scheduled for May 3rd at 11:00 to see you. Is that appt okay or does she need to be worked in earlier in the week?

## 2018-02-22 ENCOUNTER — Ambulatory Visit (INDEPENDENT_AMBULATORY_CARE_PROVIDER_SITE_OTHER): Payer: 59 | Admitting: Internal Medicine

## 2018-02-22 ENCOUNTER — Encounter: Payer: Self-pay | Admitting: Internal Medicine

## 2018-02-22 VITALS — BP 148/70 | HR 77 | Temp 98.3°F | Resp 15 | Ht 63.0 in | Wt 180.8 lb

## 2018-02-22 DIAGNOSIS — E669 Obesity, unspecified: Secondary | ICD-10-CM

## 2018-02-22 DIAGNOSIS — E1169 Type 2 diabetes mellitus with other specified complication: Secondary | ICD-10-CM

## 2018-02-22 DIAGNOSIS — R030 Elevated blood-pressure reading, without diagnosis of hypertension: Secondary | ICD-10-CM

## 2018-02-22 DIAGNOSIS — K869 Disease of pancreas, unspecified: Secondary | ICD-10-CM | POA: Diagnosis not present

## 2018-02-22 DIAGNOSIS — I808 Phlebitis and thrombophlebitis of other sites: Secondary | ICD-10-CM | POA: Diagnosis not present

## 2018-02-22 DIAGNOSIS — K8689 Other specified diseases of pancreas: Secondary | ICD-10-CM

## 2018-02-22 LAB — POCT GLYCOSYLATED HEMOGLOBIN (HGB A1C): Hemoglobin A1C: 7

## 2018-02-22 MED ORDER — GLIPIZIDE 5 MG PO TABS: ORAL_TABLET | ORAL | 0 refills | Status: DC

## 2018-02-22 NOTE — Progress Notes (Signed)
Subjective:  Patient ID: Kimberly Brady, female    DOB: Jun 29, 1951  Age: 67 y.o. MRN: [id number]  CC: The primary encounter diagnosis was Diabetes mellitus type 2 in obese ([place]). Diagnoses of Elevated blood pressure reading without diagnosis of hypertension and Pancreatic mass were also pertinent to this visit.  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  The pancreatic mass was  found during workup for sudden onset of nausea, back pain , fever and chills  in January .   All biopsies have been repeatedly negative for malignanacy and decision was made to treat as inflammatory with long term use of prednisone.  She feels much better , has a good appetite , and is happy to report that she has not gained any of the weight back in spite of the prednisone       Currently taking  40 mg daily  Until May 18,  With repeat CT planned by [facility] oncologist Dr Zenia Resides   BS went up to 300 initially,  Now in the  200's occasionally  High 180's in the morning,  bettter by end of day    Blood pressure has been elevated as well.  Lab Results  Component Value Date   HGBA1C 7.0 02/22/2018    fastings are low 100 to 120,  2 hr post prandials 180 to 200   34 lb st loss since Aprl 2018 (16 lbs since January )      Outpatient Medications Prior to Visit  Medication Sig Dispense Refill  . ACCU-CHEK FASTCLIX LANCETS MISC TEST ONCE DAILY 102 each 0  . ACCU-CHEK GUIDE test strip TEST ONCE DAILY 100 each 1  . ibuprofen (ADVIL,MOTRIN) 400 MG tablet Take 400 mg by mouth every 6 (six) hours as needed.    . predniSONE (DELTASONE) 20 MG tablet Take 40 mg by mouth daily.  0  . ACCU-CHEK  GUIDE test strip TEST ONCE DAILY (Patient not taking: Reported on 02/22/2018) 100 each 1  . doxycycline (VIBRAMYCIN) 100 MG capsule Take 1 capsule (100 mg total) by mouth 2 (two) times daily. (Patient not taking: Reported on 02/22/2018) 14 capsule 0  . Insulin Pen Needle (PEN NEEDLES) 31G X 6 MM MISC For use with victoza /saxenda (Patient not taking: Reported on 01/10/2018) 100 each 0   No facility-administered medications prior to visit.     Review of Systems;  Patient denies headache, fevers, malaise, unintentional weight loss, skin rash, eye pain, sinus congestion and sinus pain, sore throat, dysphagia,  hemoptysis , cough, dyspnea, wheezing, chest pain, palpitations, orthopnea, edema, abdominal pain, nausea, melena, diarrhea, constipation, flank pain, dysuria, hematuria, urinary  Frequency, nocturia, numbness, tingling, seizures,  Focal weakness, Loss of consciousness,  Tremor, insomnia, depression, anxiety, and suicidal ideation.      Objective:  BP (!) 148/70 (BP Location: Left Arm, Patient Position: Sitting, Cuff Size: Large)   Pulse 77   Temp 98.3 F (36.8 C) (Oral)   Resp 15   Ht 5\' 3"  (1.6 m)   Wt 180 lb 12.8 oz (82 kg)   SpO2 97%   BMI 32.03 kg/m   BP Readings from Last 3 Encounters:  02/22/18 (!) 148/70  01/10/18 (!) 153/50  11/16/17 (!) 161/91    Wt Readings from Last 3 Encounters:  02/22/18 180 lb 12.8 oz (82 kg)  01/10/18 185 lb (83.9 kg)  11/16/17 193 lb (87.5 kg)    General appearance: alert, cooperative and appears stated age Ears: normal TM's and external ear canals both ears Throat: lips, mucosa, and tongue normal; teeth and gums normal Neck: no adenopathy, no carotid bruit, supple, symmetrical, trachea midline and thyroid not enlarged, symmetric, no tenderness/mass/nodules Back: symmetric, no curvature. ROM normal. No CVA tenderness. Lungs: clear to auscultation bilaterally Heart: regular rate and rhythm, S1, S2 normal, no murmur, click, rub or  gallop Abdomen: soft, non-tender; bowel sounds normal; no masses,  no organomegaly Pulses: 2+ and symmetric Skin: Skin color, texture, turgor normal. No rashes or lesions Lymph nodes: Cervical, supraclavicular, and axillary nodes normal.  Lab Results  Component Value Date   HGBA1C 7.0 02/22/2018   HGBA1C 6.5 09/21/2017   HGBA1C 6.5 06/15/2017    Lab Results  Component Value Date   CREATININE 0.63 11/16/2017   CREATININE 0.67 10/24/2017   CREATININE 0.67 09/21/2017    Lab Results  Component Value Date   WBC 2.8 (L) 11/16/2017   HGB 14.8 11/16/2017   HCT 43.3 11/16/2017   PLT 127 (L) 11/16/2017   GLUCOSE 143 (H) 11/16/2017   CHOL 147 09/21/2017   TRIG 139.0 09/21/2017   HDL 46.00 09/21/2017   LDLDIRECT 118.0 02/09/2017   LDLCALC 74 09/21/2017   ALT 227 (H) 11/16/2017   AST 109 (H) 11/16/2017   NA 136 11/16/2017   K 3.9 11/16/2017   CL 99 (L) 11/16/2017   CREATININE 0.63 11/16/2017   BUN 9 11/16/2017   CO2 27 11/16/2017   INR 0.85 06/18/2016   HGBA1C 7.0 02/22/2018   MICROALBUR <0.7 02/09/2017    No results found.  Assessment & Plan:   Problem List Items Addressed This Visit    Pancreatic mass    Repeated attempts to rule out malignancy have been negative.  treating as inflammatory mass  With repeat imaging planned monthly       Elevated blood pressure reading without diagnosis of hypertension    She has no history of hypertension but has had several elevated readings, all in the doctors' offices.  She has been asked to check her pressures at home and submit readings for evaluation.   Lab Results  Component Value Date   MICROALBUR <0.7 02/09/2017         Relevant Orders   Comprehensive metabolic panel   Microalbumin / creatinine urine ratio   Diabetes mellitus type 2 in obese (HCC) - Primary    Adding glipizide 2.5 mg with dinner for elevations due to chronic prednisone use for management of inflammatory mass in GI tract   Lab Results  Component  Value Date   HGBA1C 7.0 02/22/2018         Relevant Medications   glipiZIDE (GLUCOTROL) 5 MG tablet   Other Relevant Orders   POCT HgB A1C (Completed)   Lipid panel   Microalbumin / creatinine urine ratio     A total of 25 minutes was spent with patient more than half of which was spent in counseling patient on the above mentioned issues , reviewing and explaining recent labs and imaging studies done, and coordination of care. I have discontinued Nanami B. Medford "Debby"'s Pen Needles and doxycycline. I am also having her start on glipiZIDE. Additionally, I am having her  maintain her ACCU-CHEK GUIDE, ACCU-CHEK FASTCLIX LANCETS, ibuprofen, and predniSONE.  Meds ordered this encounter  Medications  . glipiZIDE (GLUCOTROL) 5 MG tablet    Sig: 0.5 tablet once daily before biggest meal    Dispense:  45 tablet    Refill:  0    Medications Discontinued During This Encounter  Medication Reason  . ACCU-CHEK GUIDE test strip Duplicate  . doxycycline (VIBRAMYCIN) 100 MG capsule Completed Course  . Insulin Pen Needle (PEN NEEDLES) 31G X 6 MM MISC Patient has not taken in last 30 days    Follow-up: No follow-ups on file.   Crecencio Mc, MD

## 2018-02-22 NOTE — Patient Instructions (Addendum)
Start the glipizide  using 1/2 tablet with lunch only   If home BP is < 140/80  No meds needed

## 2018-02-24 ENCOUNTER — Encounter: Payer: Self-pay | Admitting: Internal Medicine

## 2018-02-24 DIAGNOSIS — R030 Elevated blood-pressure reading, without diagnosis of hypertension: Secondary | ICD-10-CM | POA: Insufficient documentation

## 2018-02-24 NOTE — Assessment & Plan Note (Signed)
Repeated attempts to rule out malignancy have been negative.  treating as inflammatory mass  With repeat imaging planned monthly

## 2018-02-24 NOTE — Assessment & Plan Note (Signed)
She has no history of hypertension but has had several elevated readings, all in the doctors' offices.  She has been asked to check her pressures at home and submit readings for evaluation.   Lab Results  Component Value Date   MICROALBUR <0.7 02/09/2017

## 2018-02-24 NOTE — Assessment & Plan Note (Signed)
Adding glipizide 2.5 mg with dinner for elevations due to chronic prednisone use for management of inflammatory mass in GI tract   Lab Results  Component Value Date   HGBA1C 7.0 02/22/2018

## 2018-03-04 ENCOUNTER — Encounter: Payer: Self-pay | Admitting: Internal Medicine

## 2018-03-06 DIAGNOSIS — R911 Solitary pulmonary nodule: Secondary | ICD-10-CM | POA: Diagnosis not present

## 2018-03-06 DIAGNOSIS — K869 Disease of pancreas, unspecified: Secondary | ICD-10-CM | POA: Diagnosis not present

## 2018-03-08 DIAGNOSIS — K869 Disease of pancreas, unspecified: Secondary | ICD-10-CM | POA: Diagnosis not present

## 2018-03-08 DIAGNOSIS — K831 Obstruction of bile duct: Secondary | ICD-10-CM | POA: Diagnosis not present

## 2018-03-27 ENCOUNTER — Other Ambulatory Visit: Payer: Self-pay | Admitting: Internal Medicine

## 2018-03-29 DIAGNOSIS — R17 Unspecified jaundice: Secondary | ICD-10-CM | POA: Diagnosis not present

## 2018-03-29 DIAGNOSIS — K869 Disease of pancreas, unspecified: Secondary | ICD-10-CM | POA: Diagnosis not present

## 2018-04-02 DIAGNOSIS — K838 Other specified diseases of biliary tract: Secondary | ICD-10-CM | POA: Diagnosis not present

## 2018-04-02 DIAGNOSIS — E119 Type 2 diabetes mellitus without complications: Secondary | ICD-10-CM | POA: Diagnosis not present

## 2018-04-02 DIAGNOSIS — Z79899 Other long term (current) drug therapy: Secondary | ICD-10-CM | POA: Diagnosis not present

## 2018-04-02 DIAGNOSIS — Z7984 Long term (current) use of oral hypoglycemic drugs: Secondary | ICD-10-CM | POA: Diagnosis not present

## 2018-04-02 DIAGNOSIS — R17 Unspecified jaundice: Secondary | ICD-10-CM | POA: Diagnosis not present

## 2018-04-02 DIAGNOSIS — Z791 Long term (current) use of non-steroidal anti-inflammatories (NSAID): Secondary | ICD-10-CM | POA: Diagnosis not present

## 2018-04-02 DIAGNOSIS — Z90710 Acquired absence of both cervix and uterus: Secondary | ICD-10-CM | POA: Diagnosis not present

## 2018-04-02 DIAGNOSIS — K8050 Calculus of bile duct without cholangitis or cholecystitis without obstruction: Secondary | ICD-10-CM | POA: Diagnosis not present

## 2018-04-02 DIAGNOSIS — K831 Obstruction of bile duct: Secondary | ICD-10-CM | POA: Diagnosis not present

## 2018-04-02 DIAGNOSIS — Z4659 Encounter for fitting and adjustment of other gastrointestinal appliance and device: Secondary | ICD-10-CM | POA: Diagnosis not present

## 2018-04-02 DIAGNOSIS — Z9889 Other specified postprocedural states: Secondary | ICD-10-CM | POA: Diagnosis not present

## 2018-04-02 DIAGNOSIS — Z9049 Acquired absence of other specified parts of digestive tract: Secondary | ICD-10-CM | POA: Diagnosis not present

## 2018-04-05 DIAGNOSIS — D696 Thrombocytopenia, unspecified: Secondary | ICD-10-CM | POA: Diagnosis not present

## 2018-04-05 DIAGNOSIS — K831 Obstruction of bile duct: Secondary | ICD-10-CM | POA: Diagnosis not present

## 2018-04-11 ENCOUNTER — Encounter: Payer: Self-pay | Admitting: Surgical Oncology

## 2018-04-17 ENCOUNTER — Other Ambulatory Visit: Payer: Self-pay | Admitting: Internal Medicine

## 2018-05-08 DIAGNOSIS — D696 Thrombocytopenia, unspecified: Secondary | ICD-10-CM | POA: Diagnosis not present

## 2018-05-10 DIAGNOSIS — D696 Thrombocytopenia, unspecified: Secondary | ICD-10-CM | POA: Diagnosis not present

## 2018-05-24 DIAGNOSIS — K831 Obstruction of bile duct: Secondary | ICD-10-CM | POA: Diagnosis not present

## 2018-05-24 DIAGNOSIS — R7989 Other specified abnormal findings of blood chemistry: Secondary | ICD-10-CM | POA: Diagnosis not present

## 2018-05-24 DIAGNOSIS — D696 Thrombocytopenia, unspecified: Secondary | ICD-10-CM | POA: Diagnosis not present

## 2018-06-07 ENCOUNTER — Other Ambulatory Visit: Payer: Self-pay | Admitting: Internal Medicine

## 2018-06-19 DIAGNOSIS — R933 Abnormal findings on diagnostic imaging of other parts of digestive tract: Secondary | ICD-10-CM | POA: Diagnosis not present

## 2018-06-19 DIAGNOSIS — Z4659 Encounter for fitting and adjustment of other gastrointestinal appliance and device: Secondary | ICD-10-CM | POA: Diagnosis not present

## 2018-06-19 DIAGNOSIS — Z79899 Other long term (current) drug therapy: Secondary | ICD-10-CM | POA: Diagnosis not present

## 2018-06-19 DIAGNOSIS — K831 Obstruction of bile duct: Secondary | ICD-10-CM | POA: Diagnosis not present

## 2018-06-19 DIAGNOSIS — Z9049 Acquired absence of other specified parts of digestive tract: Secondary | ICD-10-CM | POA: Diagnosis not present

## 2018-06-19 DIAGNOSIS — Z4689 Encounter for fitting and adjustment of other specified devices: Secondary | ICD-10-CM | POA: Diagnosis not present

## 2018-06-19 DIAGNOSIS — Z7984 Long term (current) use of oral hypoglycemic drugs: Secondary | ICD-10-CM | POA: Diagnosis not present

## 2018-06-19 DIAGNOSIS — E119 Type 2 diabetes mellitus without complications: Secondary | ICD-10-CM | POA: Diagnosis not present

## 2018-06-19 DIAGNOSIS — Z9889 Other specified postprocedural states: Secondary | ICD-10-CM | POA: Diagnosis not present

## 2018-06-19 DIAGNOSIS — R935 Abnormal findings on diagnostic imaging of other abdominal regions, including retroperitoneum: Secondary | ICD-10-CM | POA: Diagnosis not present

## 2018-06-20 ENCOUNTER — Other Ambulatory Visit: Payer: Self-pay | Admitting: Internal Medicine

## 2018-06-20 DIAGNOSIS — Z1231 Encounter for screening mammogram for malignant neoplasm of breast: Secondary | ICD-10-CM

## 2018-06-26 ENCOUNTER — Ambulatory Visit
Admission: RE | Admit: 2018-06-26 | Discharge: 2018-06-26 | Disposition: A | Discharge status: Home / Self Care | Payer: 59 | Source: Ambulatory Visit | Attending: Internal Medicine | Admitting: Internal Medicine

## 2018-06-26 DIAGNOSIS — Z1231 Encounter for screening mammogram for malignant neoplasm of breast: Secondary | ICD-10-CM | POA: Insufficient documentation

## 2018-06-27 ENCOUNTER — Telehealth: Payer: Self-pay

## 2018-06-27 NOTE — Telephone Encounter (Signed)
Spoke with pt and we have moved her appt to 07/12/2018 at 11:30am. Pt is aware of appt date and time.

## 2018-06-27 NOTE — Telephone Encounter (Signed)
Copied from [place] [id number]. Topic: General - Other >> Jun 27, 2018  8:07 AM Carolyn Stare wrote:  Pt is moving to [facility] in 08/11/18 and would like to see Dr Derrel Nip before she moves and would like a call back. She would like to see her this month

## 2018-07-02 DIAGNOSIS — D696 Thrombocytopenia, unspecified: Secondary | ICD-10-CM | POA: Diagnosis not present

## 2018-07-08 DIAGNOSIS — H5022 Vertical strabismus, left eye: Secondary | ICD-10-CM | POA: Diagnosis not present

## 2018-07-12 ENCOUNTER — Ambulatory Visit: Payer: 59 | Admitting: Internal Medicine

## 2018-07-12 ENCOUNTER — Encounter: Payer: Self-pay | Admitting: Internal Medicine

## 2018-07-12 VITALS — BP 120/76 | HR 61 | Temp 97.9°F | Resp 14 | Ht 63.0 in | Wt 168.4 lb

## 2018-07-12 DIAGNOSIS — D696 Thrombocytopenia, unspecified: Secondary | ICD-10-CM

## 2018-07-12 DIAGNOSIS — E669 Obesity, unspecified: Secondary | ICD-10-CM

## 2018-07-12 DIAGNOSIS — K8689 Other specified diseases of pancreas: Secondary | ICD-10-CM

## 2018-07-12 DIAGNOSIS — E1169 Type 2 diabetes mellitus with other specified complication: Secondary | ICD-10-CM | POA: Diagnosis not present

## 2018-07-12 DIAGNOSIS — K869 Disease of pancreas, unspecified: Secondary | ICD-10-CM

## 2018-07-12 DIAGNOSIS — E119 Type 2 diabetes mellitus without complications: Secondary | ICD-10-CM | POA: Diagnosis not present

## 2018-07-12 LAB — POCT GLYCOSYLATED HEMOGLOBIN (HGB A1C): HEMOGLOBIN A1C: 6.2 % — AB (ref 4.0–5.6)

## 2018-07-12 NOTE — Progress Notes (Signed)
Subjective:  Patient ID: Kimberly Brady Brady, female    DOB: [birth year]  Age: 67 y.o. MRN: [id number]  CC: The primary encounter diagnosis was Diabetes mellitus type 2 in obese ([place]). Diagnoses of Diabetes mellitus without complication ([place]), Pancreatic mass, and Thrombocytopenia ([place]) were also pertinent to this visit.  HPI Kimberly Brady Brady presents for follow up on diabetes.  Patient has been having multiple procedures and referrals over the past 4 months   to rule out pancreatic/biliary cancer . Most recent EUS with biopsy was inconclusive for malignancy.  She also Saw hematology to address new onset MILD  thrombocytopenia FIRST NOTED in nov 2018 and dramaticallt worse   Over the last 6 months, autominnune  etiology suspected. \      Was treated with 40 mg prednisone  daily form  March to end of may  With no weight gain despite increased appetite.    no complaints today.   Weight loss to 168 lbs noted  BMI under 30 for the first time .  Patient is following a low glycemic index diet and controlling her diabetes without medications.  Fasting sugars have been under less than 140 most of the time and post prandials have been under 160 except on rare occasions. Patient is exercising about 3 times per week and intentionally trying to lose weight .  Patient has had an eye exam in the last 12 months and checks feet regularly for signs of infection.  Patient does not walk barefoot outside,  And denies an numbness tingling or burning in feet. Patient is up to date on all recommended vaccinations  Lab Results  Component Value Date   WBC 2.8 (L) 11/16/2017   HGB 14.8 11/16/2017   HCT 43.3 11/16/2017   MCV 88.2 11/16/2017   PLT 127 (L) 11/16/2017   Lab Results  Component Value Date   HGBA1C 6.2 (A) 07/12/2018     Outpatient Medications Prior to Visit  Medication Sig Dispense Refill  . ACCU-CHEK FASTCLIX LANCETS MISC TEST ONCE DAILY 102 each 0  . ACCU-CHEK GUIDE test strip TEST ONCE  DAILY 100 each 1  . ACCU-CHEK GUIDE test strip TEST ONCE DAILY 100 each 1  . Multiple Vitamins-Minerals (MULTIVITAMIN ADULT PO) Take 1 tablet by mouth daily.    Marland Kitchen glipiZIDE (GLUCOTROL) 5 MG tablet TAKE 1/2 TABLET BY MOUTH ONCE DAILY BEFORE BIGGEST MEAL (Patient not taking: Reported on 07/12/2018) 45 tablet 2  . ibuprofen (ADVIL,MOTRIN) 400 MG tablet Take 400 mg by mouth every 6 (six) hours as needed.    . predniSONE (DELTASONE) 20 MG tablet Take 40 mg by mouth daily.  0   No facility-administered medications prior to visit.     Review of Systems;  Patient denies headache, fevers, malaise, unintentional weight loss, skin rash, eye pain, sinus congestion and sinus pain, sore throat, dysphagia,  hemoptysis , cough, dyspnea, wheezing, chest pain, palpitations, orthopnea, edema, abdominal pain, nausea, melena, diarrhea, constipation, flank pain, dysuria, hematuria, urinary  Frequency, nocturia, numbness, tingling, seizures,  Focal weakness, Loss of consciousness,  Tremor, insomnia, depression, anxiety, and suicidal ideation.      Objective:  BP 120/76 (BP Location: Left Arm, Patient Position: Sitting, Cuff Size: Normal)   Pulse 61   Temp 97.9 F (36.6 C) (Oral)   Resp 14   Ht 5\' 3"  (1.6 m)   Wt 168 lb 6.4 oz (76.4 kg)   SpO2 99%   BMI 29.83 kg/m   BP Readings from Last 3 Encounters:  07/12/18  120/76  02/22/18 (!) 148/70  01/10/18 (!) 153/50    Wt Readings from Last 3 Encounters:  07/12/18 168 lb 6.4 oz (76.4 kg)  02/22/18 180 lb 12.8 oz (82 kg)  01/10/18 185 lb (83.9 kg)    General appearance: alert, cooperative and appears stated age Ears: normal TM's and external ear canals both ears Throat: lips, mucosa, and tongue normal; teeth and gums normal Neck: no adenopathy, no carotid bruit, supple, symmetrical, trachea midline and thyroid not enlarged, symmetric, no tenderness/mass/nodules Back: symmetric, no curvature. ROM normal. No CVA tenderness. Lungs: clear to auscultation  bilaterally Heart: regular rate and rhythm, S1, S2 normal, no murmur, click, rub or gallop Abdomen: soft, non-tender; bowel sounds normal; no masses,  no organomegaly Pulses: 2+ and symmetric Skin: Skin color, texture, turgor normal. No rashes or lesions Lymph nodes: Cervical, supraclavicular, and axillary nodes normal.  Lab Results  Component Value Date   HGBA1C 6.2 (A) 07/12/2018   HGBA1C 7.0 02/22/2018   HGBA1C 6.5 09/21/2017    Lab Results  Component Value Date   CREATININE 0.63 11/16/2017   CREATININE 0.67 10/24/2017   CREATININE 0.67 09/21/2017    Lab Results  Component Value Date   WBC 2.8 (L) 11/16/2017   HGB 14.8 11/16/2017   HCT 43.3 11/16/2017   PLT 127 (L) 11/16/2017   GLUCOSE 143 (H) 11/16/2017   CHOL 147 09/21/2017   TRIG 139.0 09/21/2017   HDL 46.00 09/21/2017   LDLDIRECT 118.0 02/09/2017   LDLCALC 74 09/21/2017   ALT 227 (H) 11/16/2017   AST 109 (H) 11/16/2017   NA 136 11/16/2017   K 3.9 11/16/2017   CL 99 (L) 11/16/2017   CREATININE 0.63 11/16/2017   BUN 9 11/16/2017   CO2 27 11/16/2017   INR 0.85 06/18/2016   HGBA1C 6.2 (A) 07/12/2018   MICROALBUR <0.7 02/09/2017    Mm 3d Screen Breast Bilateral  Result Date: 06/27/2018 CLINICAL DATA:  Screening. EXAM: DIGITAL SCREENING BILATERAL MAMMOGRAM WITH TOMO AND CAD COMPARISON:  Previous exam(s). ACR Breast Density Category b: There are scattered areas of fibroglandular density. FINDINGS: There are no findings suspicious for malignancy. Images were processed with CAD. IMPRESSION: No mammographic evidence of malignancy. A result letter of this screening mammogram will be mailed directly to the patient. RECOMMENDATION: Screening mammogram in one year. (Code:SM-B-01Y) BI-RADS CATEGORY  1: Negative. Electronically Signed   By: Fidela Salisbury M.D.   On: 06/27/2018 13:24    Assessment & Plan:   Problem List Items Addressed This Visit    Diabetes mellitus without complication ([place]) - Primary    Currently  well-controlled on diet alone .  hemoglobin A1c is at goal of less than 7.0 . Patient is reminded to schedule an annual eye exam and foot exam is normal today. Patient has no microalbuminuria. Patient is tolerating statin therapy for CAD risk reduction and on ACE/ARB for renal protection and hypertension       Pancreatic mass    No evidence of malignancy  By biopsy despite repeated attempts .  She is status post biliary stenting  With plans to return to [facility] in a few months for follow up      Thrombocytopenia ([place])    With a nadir of 60K.  Workup underway at [facility] for autoimmune causes.  She was treated with high dose  prednisone and platelet count returned to near normal.        A total of 25 minutes was spent with patient more than half of  which was spent in counseling patient on the above mentioned issues , reviewing and explaining recent labs and imaging studies done, and coordination of care.    I have discontinued Kimberly Brady Brady "Debby"'s ibuprofen, predniSONE, and glipiZIDE. I am also having her maintain her ACCU-CHEK FASTCLIX LANCETS, ACCU-CHEK GUIDE, ACCU-CHEK GUIDE, and Multiple Vitamins-Minerals (MULTIVITAMIN ADULT PO).  No orders of the defined types were placed in this encounter.   Medications Discontinued During This Encounter  Medication Reason  . glipiZIDE (GLUCOTROL) 5 MG tablet Patient has not taken in last 30 days  . ibuprofen (ADVIL,MOTRIN) 400 MG tablet Patient has not taken in last 30 days  . predniSONE (DELTASONE) 20 MG tablet Completed Course    Follow-up: No follow-ups on file.   Crecencio Mc, MD

## 2018-07-12 NOTE — Patient Instructions (Addendum)
You do not need to gain weight!!    Your BMI is finally < 30 !!    Red's Riced Cauliflower with white meat chicken cilantro lime    Is a great gluten free lunch microwaveable in 3 minutes

## 2018-07-14 NOTE — Assessment & Plan Note (Addendum)
With a nadir of 60K.  Workup underway at [facility] for autoimmune causes.  She was treated with high dose  prednisone and platelet count returned to near normal.

## 2018-07-14 NOTE — Assessment & Plan Note (Signed)
Currently well-controlled on diet alone  .  hemoglobin A1c is at goal of less than 7.0 . Patient is reminded to schedule an annual eye exam and foot exam is normal today. Patient has no microalbuminuria. Patient is tolerating statin therapy for CAD risk reduction and on ACE/ARB for renal protection and hypertension  

## 2018-07-14 NOTE — Assessment & Plan Note (Signed)
No evidence of malignancy  By biopsy despite repeated attempts .  She is status post biliary stenting  With plans to return to [facility] in a few months for follow up

## 2018-08-02 ENCOUNTER — Ambulatory Visit: Payer: 59 | Admitting: Internal Medicine

## 2018-10-25 ENCOUNTER — Encounter: Payer: Self-pay | Admitting: Internal Medicine

## 2018-10-29 MED ORDER — CHOLESTYRAMINE 4 G PO PACK
4.00 | PACK | ORAL | Status: DC
Start: 2018-10-29 — End: 2018-10-29

## 2018-10-29 MED ORDER — ONDANSETRON HCL 4 MG/2ML IJ SOLN
4.00 | INTRAMUSCULAR | Status: DC
Start: ? — End: 2018-10-29

## 2018-10-29 MED ORDER — CIPROFLOXACIN HCL 500 MG PO TABS
500.00 | ORAL_TABLET | ORAL | Status: DC
Start: 2018-10-29 — End: 2018-10-29

## 2018-10-29 MED ORDER — OXYCODONE HCL 5 MG PO TABS
5.00 | ORAL_TABLET | ORAL | Status: DC
Start: ? — End: 2018-10-29

## 2018-10-29 MED ORDER — ACETAMINOPHEN 325 MG PO TABS
650.00 | ORAL_TABLET | ORAL | Status: DC
Start: ? — End: 2018-10-29

## 2018-10-29 MED ORDER — GLUCAGON HCL RDNA (DIAGNOSTIC) 1 MG IJ SOLR
1.00 | INTRAMUSCULAR | Status: DC
Start: ? — End: 2018-10-29

## 2018-10-29 MED ORDER — DEXTROSE 50 % IV SOLN
12.50 | INTRAVENOUS | Status: DC
Start: ? — End: 2018-10-29

## 2018-10-29 MED ORDER — INSULIN REGULAR HUMAN 100 UNIT/ML IJ SOLN
0.00 | INTRAMUSCULAR | Status: DC
Start: 2018-10-29 — End: 2018-10-29

## 2018-11-19 MED ORDER — ACETAMINOPHEN 325 MG PO TABS
650.00 | ORAL_TABLET | ORAL | Status: DC
Start: ? — End: 2018-11-19

## 2018-11-19 MED ORDER — PANTOPRAZOLE SODIUM 40 MG PO TBEC
40.00 | DELAYED_RELEASE_TABLET | ORAL | Status: DC
Start: 2018-11-22 — End: 2018-11-19

## 2018-11-19 MED ORDER — TRAMADOL HCL 50 MG PO TABS
25.00 | ORAL_TABLET | ORAL | Status: DC
Start: ? — End: 2018-11-19

## 2018-11-19 MED ORDER — HYDROMORPHONE HCL 1 MG/ML IJ SOLN
0.50 | INTRAMUSCULAR | Status: DC
Start: ? — End: 2018-11-19

## 2018-11-19 MED ORDER — DEXTROSE IN LACTATED RINGERS 5 % IV SOLN
INTRAVENOUS | Status: DC
Start: ? — End: 2018-11-19

## 2018-11-19 MED ORDER — GENERIC EXTERNAL MEDICATION
6.25 | Status: DC
Start: ? — End: 2018-11-19

## 2018-11-19 MED ORDER — HYDROMORPHONE HCL 1 MG/ML IJ SOLN
.50 | INTRAMUSCULAR | Status: DC
Start: ? — End: 2018-11-19

## 2018-11-19 MED ORDER — ONDANSETRON HCL 4 MG/2ML IJ SOLN
4.00 | INTRAMUSCULAR | Status: DC
Start: ? — End: 2018-11-19

## 2018-11-19 MED ORDER — SENNOSIDES-DOCUSATE SODIUM 8.6-50 MG PO TABS
2.00 | ORAL_TABLET | ORAL | Status: DC
Start: 2018-11-21 — End: 2018-11-19

## 2018-11-21 MED ORDER — LIDOCAINE HCL 1 % IJ SOLN
.50 | INTRAMUSCULAR | Status: DC
Start: ? — End: 2018-11-21

## 2019-02-28 MED ORDER — BAYER WOMENS 81-300 MG PO TABS
40.00 | ORAL_TABLET | ORAL | Status: DC
Start: 2019-03-01 — End: 2019-02-28

## 2019-02-28 MED ORDER — SPIRONOLACTONE 25 MG PO TABS
50.00 | ORAL_TABLET | ORAL | Status: DC
Start: 2019-03-01 — End: 2019-02-28

## 2019-02-28 MED ORDER — APIXABAN 2.5 MG PO TABS
10.00 | ORAL_TABLET | ORAL | Status: DC
Start: 2019-02-28 — End: 2019-02-28

## 2019-02-28 MED ORDER — FUROSEMIDE 40 MG PO TABS
40.00 | ORAL_TABLET | ORAL | Status: DC
Start: 2019-03-01 — End: 2019-02-28

## 2019-02-28 MED ORDER — PREDNISONE 20 MG PO TABS
20.00 | ORAL_TABLET | ORAL | Status: DC
Start: 2019-03-01 — End: 2019-02-28

## 2019-02-28 MED ORDER — GLUCAGON HCL RDNA (DIAGNOSTIC) 1 MG IJ SOLR
1.00 | INTRAMUSCULAR | Status: DC
Start: ? — End: 2019-02-28

## 2019-02-28 MED ORDER — VANCOMYCIN HCL 50 MG/ML PO SOLR
125.00 | ORAL | Status: DC
Start: 2019-03-01 — End: 2019-02-28

## 2019-02-28 MED ORDER — BENICAR 20 MG PO TABS
12.50 | ORAL_TABLET | ORAL | Status: DC
Start: ? — End: 2019-02-28

## 2019-02-28 MED ORDER — Medication
1.00 | Status: DC
Start: 2019-03-01 — End: 2019-02-28

## 2019-02-28 MED ORDER — INSULIN LISPRO 100 UNIT/ML ~~LOC~~ SOLN
0.00 | SUBCUTANEOUS | Status: DC
Start: 2019-03-01 — End: 2019-02-28

## 2019-02-28 MED ORDER — TUSSI PRES-B 2-15-200 MG/5ML PO LIQD
0.50 | ORAL | Status: DC
Start: ? — End: 2019-02-28

## 2019-02-28 MED ORDER — APIXABAN 2.5 MG PO TABS
5.00 | ORAL_TABLET | ORAL | Status: DC
Start: ? — End: 2019-02-28

## 2019-02-28 MED ORDER — QUINERVA 260 MG PO TABS
650.00 | ORAL_TABLET | ORAL | Status: DC
Start: ? — End: 2019-02-28

## 2019-02-28 MED ORDER — Medication
2.00 | Status: DC
Start: ? — End: 2019-02-28

## 2019-11-09 IMAGING — CT NM PET TUM IMG INITIAL (PI) SKULL BASE T - THIGH
1 of 9 series · 1 of 25 positions shown · non-contrast
Comparison: MRI 10/30/2017.  CT scan 10/24/2017.

CLINICAL DATA: Initial treatment strategy for pancreatic mass.
Recent endoscopic ultrasound with biopsy returned "Pancreatic tissue
with fibrosis and chronic inflammation."

EXAM:
NUCLEAR MEDICINE PET SKULL BASE TO THIGH
TECHNIQUE: 12.6 mCi F-18 FDG was injected intravenously. Full-ring PET imaging
was performed from the skull base to thigh after the radiotracer. CT
data was obtained and used for attenuation correction and anatomic
localization.
FASTING BLOOD GLUCOSE:  Value: 121 mg/dl

[Series 3: ct wb 5.0 b30f · axial · 5.0mm · 0.98mm/px · 1 of 290 slices shown]
[im 290/290  brain]
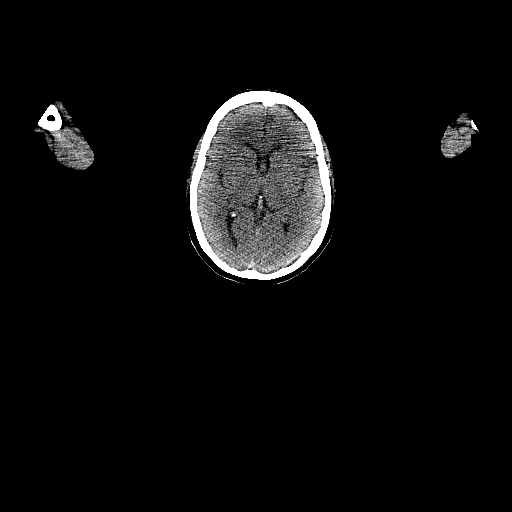

[1 of 25 positions shown; findings below may reference images not displayed]

FINDINGS: NECK: No hypermetabolic lymph nodes in the neck.

CHEST: No hypermetabolic mediastinal or hilar nodes. No suspicious
pulmonary nodules on the CT scan. Calcified lymph nodes are seen in
the right hilar region compatible with granulomatous disease.
Calcified granuloma noted posterior right lower lobe tiny 3-4 mm
right middle lobe pulmonary nodule seen image 96 series 3 is stable
since CT scan of 07/06/2016 consistent with benign etiology.. No
suspicious pulmonary nodule or mass in the left lung. No pleural
effusion.

ABDOMEN/PELVIS: Ill-defined mass along the posterior aspect of the
pancreas seen at recent MRI shows low level hypermetabolism with SUV
max = 4.6.

Typical mottled FDG uptake in the liver parenchyma evident. No liver
metastases were evident at MRI. No hypermetabolic lymphadenopathy in
the abdomen or pelvis.

Gallbladder surgically absent. There is abdominal aortic
atherosclerosis without aneurysm.

SKELETON: No focal hypermetabolic activity to suggest skeletal
metastasis.
IMPRESSION: 1. Hypermetabolic accumulation of FDG radiotracer in the known
lesion posterior to the pancreas. Neoplasm remains a concern.
2. No evidence for hypermetabolic metastatic disease in the neck,
chest, abdomen, or pelvis.
3. Stable 4 mm right middle lobe pulmonary nodule compatible with
benign etiology.

## 2020-01-22 DEATH — deceased
